# Patient Record
Sex: Male | Born: 1965
Health system: Southern US, Community
[De-identification: ages and names within clinical notes are randomized; demographics above are authoritative.]

## PROBLEM LIST (undated history)

## (undated) DIAGNOSIS — K219 Gastro-esophageal reflux disease without esophagitis: Secondary | ICD-10-CM

## (undated) DIAGNOSIS — F411 Generalized anxiety disorder: Secondary | ICD-10-CM

## (undated) DIAGNOSIS — F419 Anxiety disorder, unspecified: Secondary | ICD-10-CM

## (undated) DIAGNOSIS — E785 Hyperlipidemia, unspecified: Secondary | ICD-10-CM

## (undated) DIAGNOSIS — M1990 Unspecified osteoarthritis, unspecified site: Secondary | ICD-10-CM

## (undated) HISTORY — DX: Hyperlipidemia, unspecified: E78.5

## (undated) HISTORY — DX: Gastro-esophageal reflux disease without esophagitis: K21.9

## (undated) HISTORY — DX: Anxiety disorder, unspecified: F41.9

## (undated) HISTORY — DX: Unspecified osteoarthritis, unspecified site: M19.90

## (undated) HISTORY — PX: LEG SURGERY: SHX1003

## (undated) HISTORY — DX: Generalized anxiety disorder: F41.1

---

## 2009-12-26 ENCOUNTER — Emergency Department: Payer: Self-pay | Admitting: Emergency Medicine

## 2012-12-10 ENCOUNTER — Observation Stay: Payer: Self-pay | Admitting: Internal Medicine

## 2012-12-10 DIAGNOSIS — R079 Chest pain, unspecified: Secondary | ICD-10-CM

## 2012-12-10 LAB — BASIC METABOLIC PANEL
Anion Gap: 8 (ref 7–16)
BUN: 10 mg/dL (ref 7–18)
Calcium, Total: 8.8 mg/dL (ref 8.5–10.1)
Chloride: 111 mmol/L — ABNORMAL HIGH (ref 98–107)
Co2: 23 mmol/L (ref 21–32)
Creatinine: 0.88 mg/dL (ref 0.60–1.30)
EGFR (African American): >60
EGFR (Non-African Amer.): >60
Glucose: 94 mg/dL (ref 65–99)
Osmolality: 282 (ref 275–301)
Potassium: 4 mmol/L (ref 3.5–5.1)
Sodium: 142 mmol/L (ref 136–145)

## 2012-12-10 LAB — CBC
HCT: 40.8 % (ref 40.0–52.0)
HGB: 14.3 g/dL (ref 13.0–18.0)
MCH: 34 pg (ref 26.0–34.0)
MCHC: 35 g/dL (ref 32.0–36.0)
MCV: 97 fL (ref 80–100)
Platelet: 203 10*3/uL (ref 150–440)
RBC: 4.2 10*6/uL — ABNORMAL LOW (ref 4.40–5.90)
RDW: 13.4 % (ref 11.5–14.5)
WBC: 8 10*3/uL (ref 3.8–10.6)

## 2012-12-10 LAB — CK TOTAL AND CKMB (NOT AT ARMC)
CK, Total: 162 U/L (ref 35–232)
CK, Total: 123 U/L (ref 35–232)
CK, Total: 107 U/L (ref 35–232)
CK-MB: 0.8 ng/mL (ref 0.5–3.6)
CK-MB: 0.6 ng/mL (ref 0.5–3.6)
CK-MB: 0.5 ng/mL (ref 0.5–3.6)

## 2012-12-10 LAB — TROPONIN I
Troponin-I: <0.02 ng/mL
Troponin-I: <0.02 ng/mL
Troponin-I: <0.02 ng/mL

## 2012-12-10 LAB — LACTATE DEHYDROGENASE: LDH: 178 U/L (ref 85–241)

## 2012-12-11 LAB — COMPREHENSIVE METABOLIC PANEL
Albumin: 3.8 g/dL (ref 3.4–5.0)
Alkaline Phosphatase: 95 U/L (ref 50–136)
Anion Gap: 8 (ref 7–16)
BUN: 13 mg/dL (ref 7–18)
Bilirubin,Total: 0.2 mg/dL (ref 0.2–1.0)
Calcium, Total: 8.6 mg/dL (ref 8.5–10.1)
Chloride: 110 mmol/L — ABNORMAL HIGH (ref 98–107)
Co2: 22 mmol/L (ref 21–32)
Creatinine: 0.8 mg/dL (ref 0.60–1.30)
EGFR (African American): >60
EGFR (Non-African Amer.): >60
Glucose: 93 mg/dL (ref 65–99)
Osmolality: 279 (ref 275–301)
Potassium: 4.3 mmol/L (ref 3.5–5.1)
SGOT(AST): 20 U/L (ref 15–37)
SGPT (ALT): 25 U/L (ref 12–78)
Sodium: 140 mmol/L (ref 136–145)
Total Protein: 7.2 g/dL (ref 6.4–8.2)

## 2012-12-11 LAB — CBC WITH DIFFERENTIAL/PLATELET
Basophil #: 0.1 10*3/uL (ref 0.0–0.1)
Basophil %: 1 %
Eosinophil #: 0.7 10*3/uL (ref 0.0–0.7)
Eosinophil %: 9.1 %
HCT: 43.8 % (ref 40.0–52.0)
HGB: 15.2 g/dL (ref 13.0–18.0)
Lymphocyte #: 2.6 10*3/uL (ref 1.0–3.6)
Lymphocyte %: 31.8 %
MCH: 33.6 pg (ref 26.0–34.0)
MCHC: 34.8 g/dL (ref 32.0–36.0)
MCV: 97 fL (ref 80–100)
Monocyte #: 0.7 x10 3/mm (ref 0.2–1.0)
Monocyte %: 8.6 %
Neutrophil #: 4 10*3/uL (ref 1.4–6.5)
Neutrophil %: 49.5 %
Platelet: 198 10*3/uL (ref 150–440)
RBC: 4.54 10*6/uL (ref 4.40–5.90)
RDW: 13.2 % (ref 11.5–14.5)
WBC: 8.1 10*3/uL (ref 3.8–10.6)

## 2012-12-11 LAB — LIPID PANEL
Cholesterol: 144 mg/dL (ref 0–200)
HDL Cholesterol: 24 mg/dL — ABNORMAL LOW (ref 40–60)
Ldl Cholesterol, Calc: 82 mg/dL (ref 0–100)
Triglycerides: 191 mg/dL (ref 0–200)
VLDL Cholesterol, Calc: 38 mg/dL (ref 5–40)

## 2012-12-26 ENCOUNTER — Other Ambulatory Visit: Payer: Self-pay | Admitting: Cardiovascular Disease

## 2013-09-18 ENCOUNTER — Encounter: Payer: Self-pay | Admitting: Internal Medicine

## 2013-10-19 ENCOUNTER — Telehealth: Payer: Self-pay | Admitting: Internal Medicine

## 2013-10-19 ENCOUNTER — Ambulatory Visit: Payer: BC Managed Care – PPO | Admitting: Internal Medicine

## 2013-11-20 ENCOUNTER — Ambulatory Visit: Payer: BC Managed Care – PPO | Admitting: Internal Medicine

## 2014-01-26 IMAGING — CT CT CHEST-ABD-PELV W/ CM
1 of 3 series · 13 of 31 positions shown, 18 images · non-contrast
Comparison: none

REASON FOR EXAM: (1) weight loss, r/o malignancy; (2) ?t loss, r/o
malignancy
COMMENTS:

[Series 2: soft tissue · axial · 0.74mm/px · z∈[-483,+99]mm · 13 of 224 slices shown, 18 images]
[im 15/224  mediastinal]
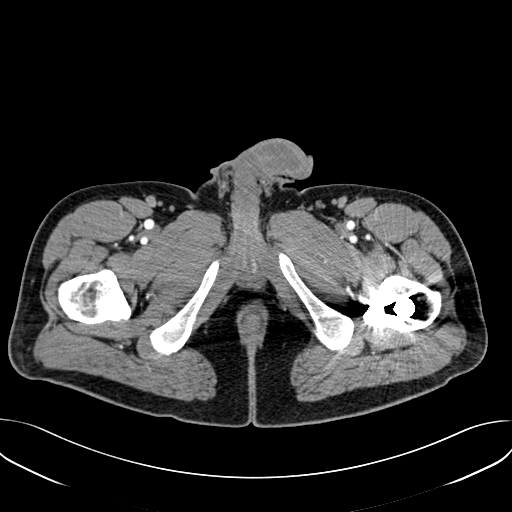
[im 15/224  bone]
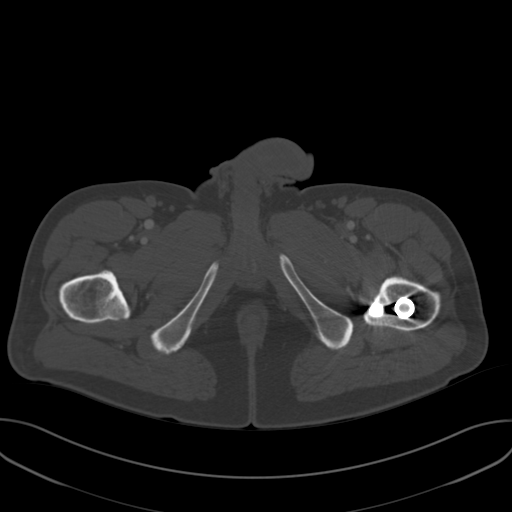
[im 45/224  mediastinal]
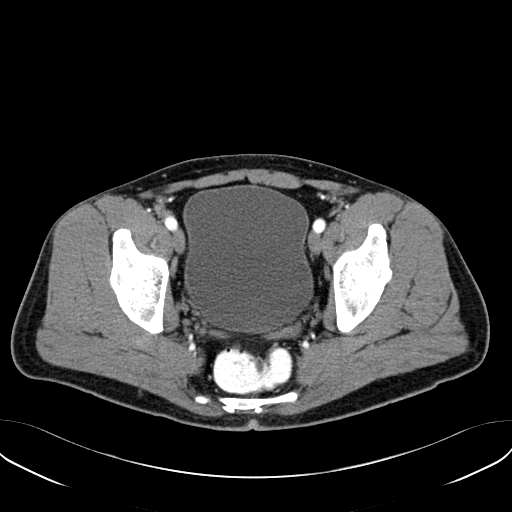
[im 60/224  mediastinal]
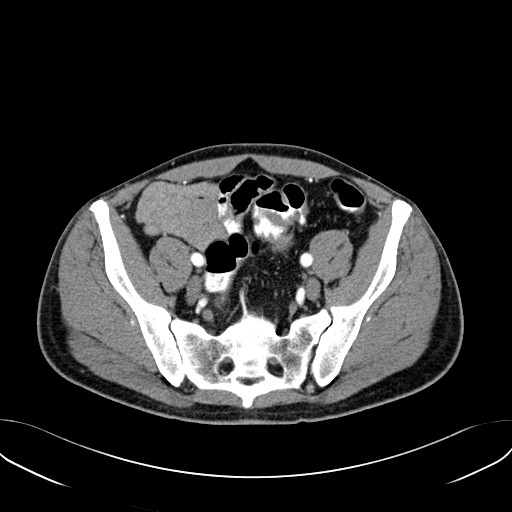
[im 75/224  mediastinal]
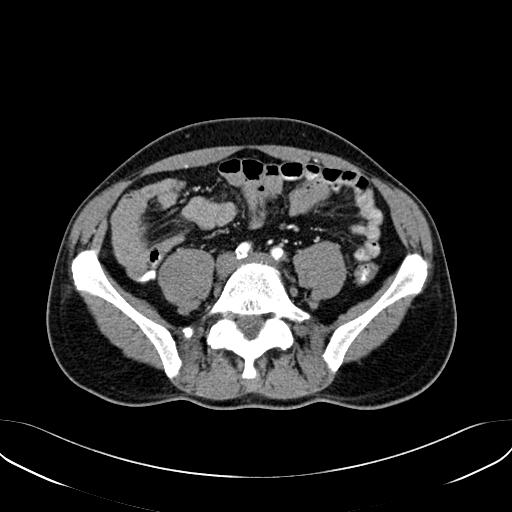
[im 105/224  mediastinal]
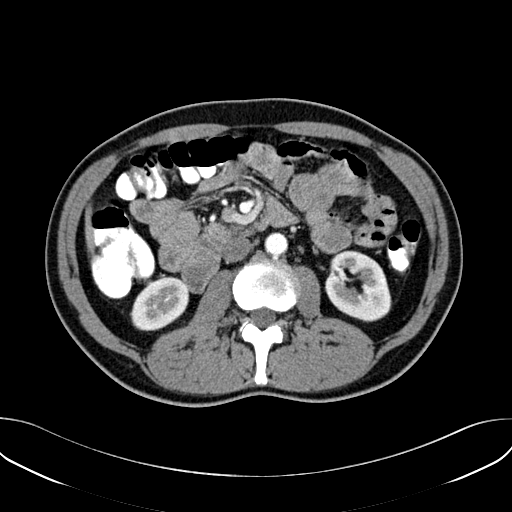
[im 110/224  mediastinal]
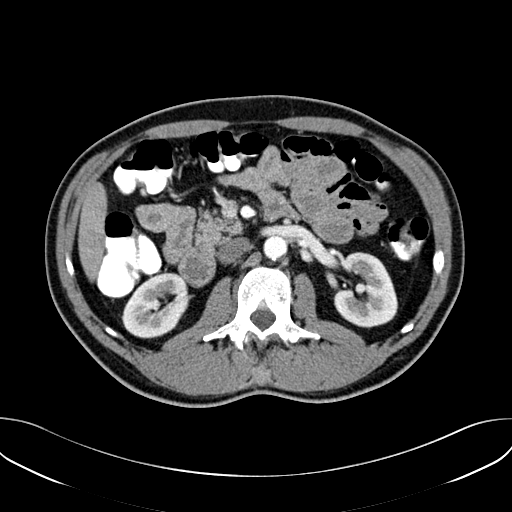
[im 112/224  mediastinal]
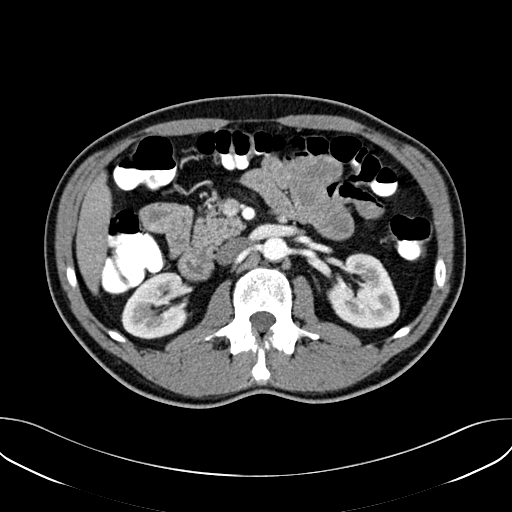
[im 134/224  mediastinal]
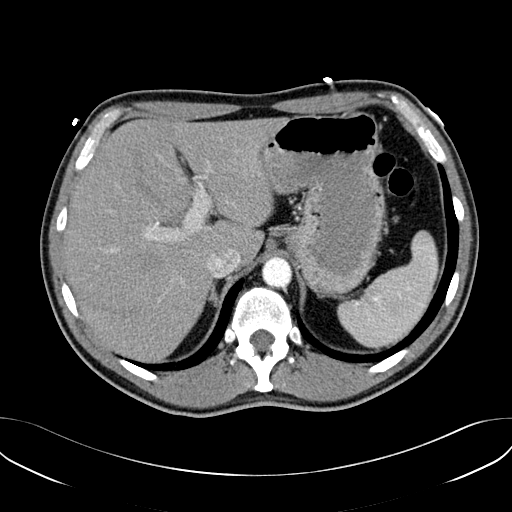
[im 149/224  mediastinal]
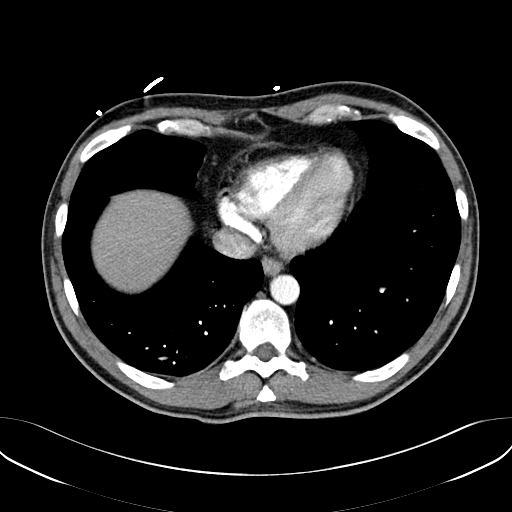
[im 149/224  bone]
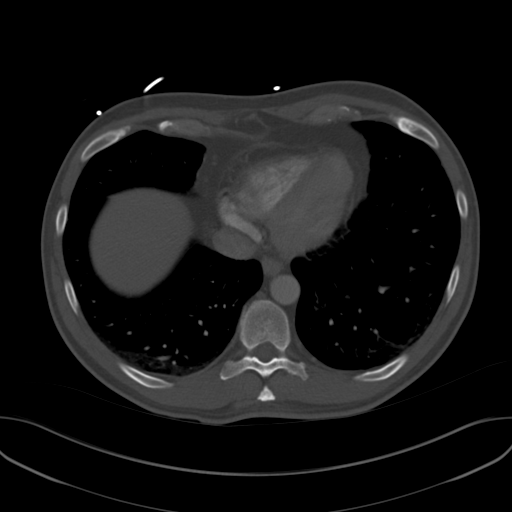
[im 164/224  mediastinal]
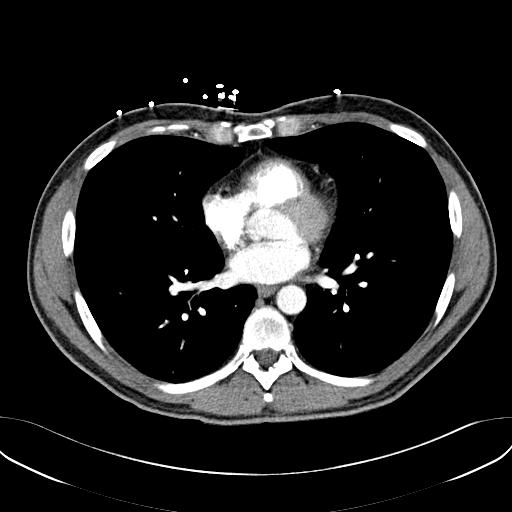
[im 164/224  lung]
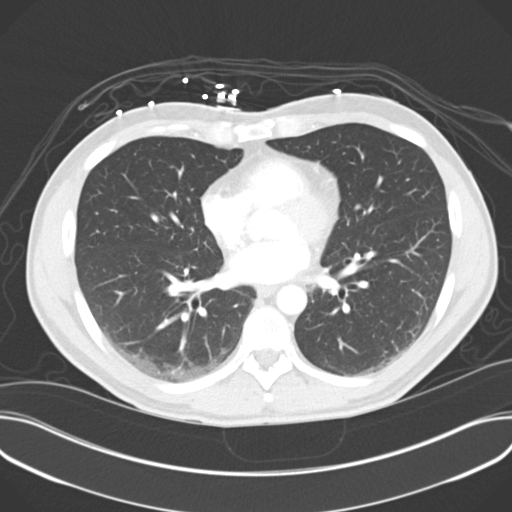
[im 179/224  lung]
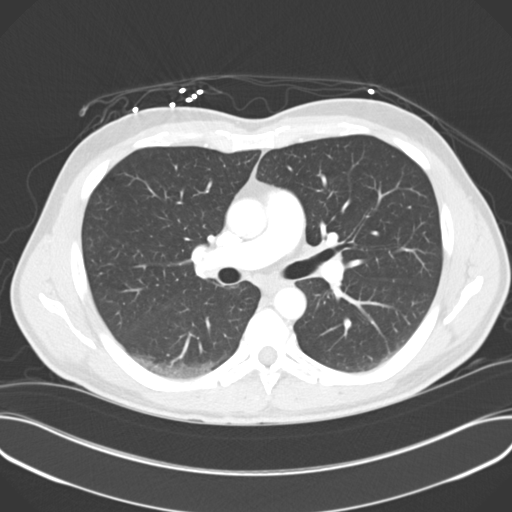
[im 194/224  mediastinal]
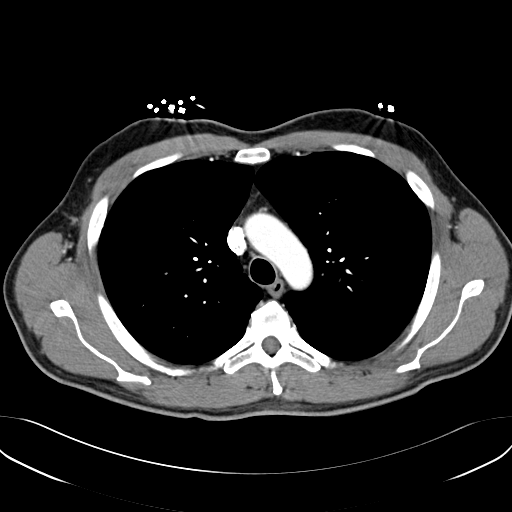
[im 194/224  lung]
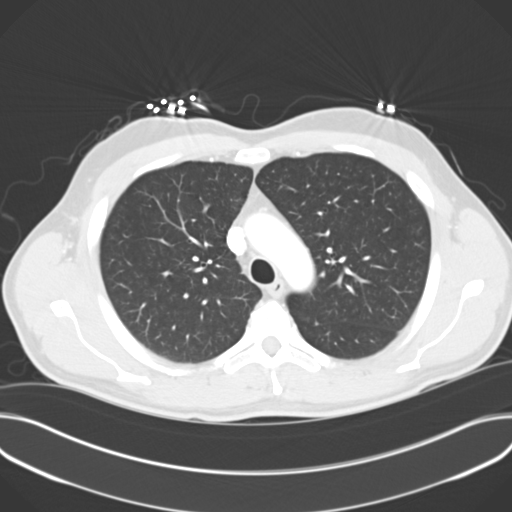
[im 209/224  mediastinal]
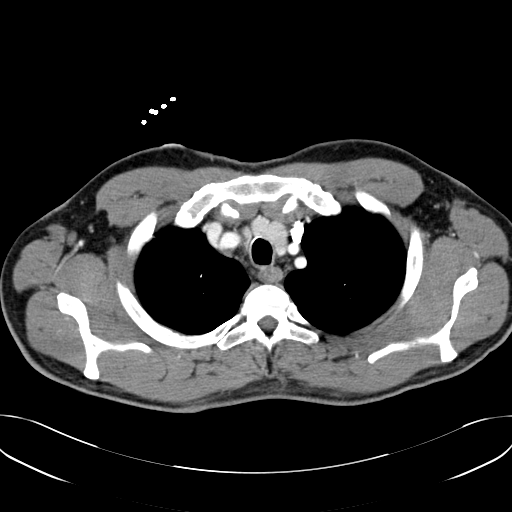
[im 209/224  lung]
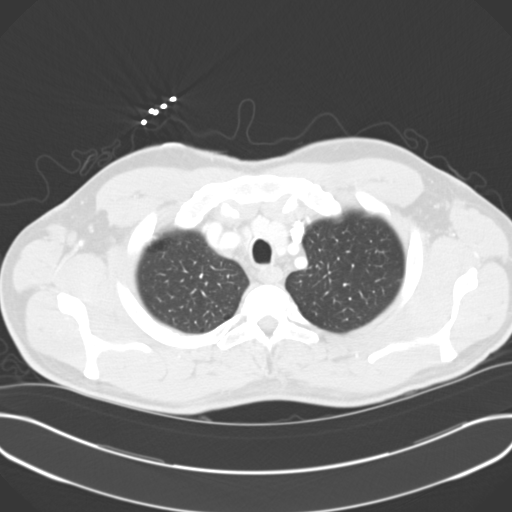

[13 of 31 positions shown; findings below may reference images not displayed]

PROCEDURE:     CT  - CT CHEST ABDOMEN AND PELVIS W  - December 10, 2012 [DATE]

RESULT:     CT of the chest common abdomen and pelvis is performed with oral
contrast and 100 mL of Asovue-ZCC iodinated intravenous contrast. Comparison
is made to previous renal stone protocol of the abdomen and pelvis from 26 December, 2009.

Dependent atelectasis is present in both lungs. There is no discrete
pulmonary parenchymal mass. There is no edema, effusion or pneumothorax.
Tiny bullous areas are seen in the medial lung apices bilaterally. The heart
is normal in size. No pleural or pericardial effusion is seen. The thyroid
lobes appear unremarkable. There is no supraclavicular, mediastinal, hilar
or axillary mass or adenopathy. There is a tiny subdiaphragmatic
low-attenuation area in the right lobe of the liver with fatty attenuation
which is too small for further characterization measuring 9 mm maximal
dimension. No other focal hepatic lesion is evident. No radiopaque
gallstones are evident. There is no oral contrast in the stomach or proximal
small bowel. There is minimal residual contrast in the distal small bowel.
The majority of contrast is in the colon. There is some mild diffuse
thickening of the wall of the sigmoid colon which could represent colitis or
chronic changes of diverticulitis. Colonoscopy would be recommended for
further assessment if the patient is asymptomatic for either of these. The
colon otherwise appears to be unremarkable. No abnormal bowel distention is
evident. There is no mesenteric or retroperitoneal mass or adenopathy. No
inguinal or pelvic mass or adenopathy is evident. There is artifact from the
patient's left hip hardware. The urinary bladder appears unremarkable. There
is no ascites areas there does not appear to be definite colonic
diverticulosis. There is some scattered atherosclerotic calcification and
irregularity in the distal abdominal aorta. The adrenal glands appear
normal. There is an exophytic cyst from the left kidney upper pole region
measuring 2.3 cm. There is a smaller cyst in the mid right kidney measuring
5 mm. No definite renal calculi are appreciated. The spleen is nonenlarged.
The pancreas shows no discrete mass or ductal dilation. There is no evidence
of adenopathy in the porta hepatis region. The abdominal wall shows a tiny
fat filled hernia in the umbilical region.
IMPRESSION: 1. Mild thickening of the wall of the sigmoid colon without definite
diverticulosis. Changes of colitis or thickening from chronic inflammation
is not excluded. If the patient has no evidence of acute infectious or
inflammatory process, further investigation with sigmoidoscopy or
colonoscopy would be recommended.
2. No adenopathy. No focal pulmonary disease. Renal cysts. A small fatty
density in the subdiaphragmatic region at the right lobe of the liver. Poor
small bowel opacification limits evaluation. There is no contrast in the
stomach. Correlation with upper endoscopy could be considered if the patient
has symptoms referable to this region.

[REDACTED]

## 2014-06-25 ENCOUNTER — Ambulatory Visit (INDEPENDENT_AMBULATORY_CARE_PROVIDER_SITE_OTHER): Payer: Self-pay | Admitting: Emergency Medicine

## 2014-06-25 ENCOUNTER — Encounter: Payer: Self-pay | Admitting: Emergency Medicine

## 2014-06-25 VITALS — BP 118/68 | HR 96 | Temp 100.0°F | Resp 16 | Wt 146.6 lb

## 2014-06-25 DIAGNOSIS — F411 Generalized anxiety disorder: Secondary | ICD-10-CM

## 2014-06-25 DIAGNOSIS — R5381 Other malaise: Secondary | ICD-10-CM

## 2014-06-25 DIAGNOSIS — R5383 Other fatigue: Principal | ICD-10-CM

## 2014-06-25 LAB — CBC WITH DIFFERENTIAL/PLATELET
Basophils Absolute: 0.1 10*3/uL (ref 0.0–0.1)
Basophils Relative: 1 % (ref 0–1)
Eosinophils Absolute: 0.3 10*3/uL (ref 0.0–0.7)
Eosinophils Relative: 4 % (ref 0–5)
HCT: 46.2 % (ref 39.0–52.0)
Hemoglobin: 16.1 g/dL (ref 13.0–17.0)
Lymphocytes Relative: 31 % (ref 12–46)
Lymphs Abs: 2.4 10*3/uL (ref 0.7–4.0)
MCH: 33.7 pg (ref 26.0–34.0)
MCHC: 34.8 g/dL (ref 30.0–36.0)
MCV: 96.7 fL (ref 78.0–100.0)
Monocytes Absolute: 0.8 10*3/uL (ref 0.1–1.0)
Monocytes Relative: 10 % (ref 3–12)
Neutro Abs: 4.2 10*3/uL (ref 1.7–7.7)
Neutrophils Relative %: 54 % (ref 43–77)
Platelets: 232 10*3/uL (ref 150–400)
RBC: 4.78 MIL/uL (ref 4.22–5.81)
RDW: 13.8 % (ref 11.5–15.5)
WBC: 7.8 10*3/uL (ref 4.0–10.5)

## 2014-06-25 MED ORDER — DIAZEPAM 2 MG PO TABS: 2.0000 mg | ORAL_TABLET | Freq: Every day | ORAL | Status: DC

## 2014-06-25 MED ORDER — SERTRALINE HCL 50 MG PO TABS: 50.0000 mg | ORAL_TABLET | Freq: Every day | ORAL | Status: DC

## 2014-06-25 NOTE — Patient Instructions (Signed)
Insomnia  PROTEIN PROTEIN PROTEIN Insomnia is frequent trouble falling and/or staying asleep. Insomnia can be a long term problem or a short term problem. Both are common. Insomnia can be a short term problem when the wakefulness is related to a certain stress or worry. Long term insomnia is often related to ongoing stress during waking hours and/or poor sleeping habits. Overtime, sleep deprivation itself can make the problem worse. Every little thing feels more severe because you are overtired and your ability to cope is decreased. CAUSES   Stress, anxiety, and depression.  Poor sleeping habits.  Distractions such as TV in the bedroom.  Naps close to bedtime.  Engaging in emotionally charged conversations before bed.  Technical reading before sleep.  Alcohol and other sedatives. They may make the problem worse. They can hurt normal sleep patterns and normal dream activity.  Stimulants such as caffeine for several hours prior to bedtime.  Pain syndromes and shortness of breath can cause insomnia.  Exercise late at night.  Changing time zones may cause sleeping problems (jet lag). It is sometimes helpful to have someone observe your sleeping patterns. They should look for periods of not breathing during the night (sleep apnea). They should also look to see how long those periods last. If you live alone or observers are uncertain, you can also be observed at a sleep clinic where your sleep patterns will be professionally monitored. Sleep apnea requires a checkup and treatment. Give your caregivers your medical history. Give your caregivers observations your family has made about your sleep.  SYMPTOMS   Not feeling rested in the morning.  Anxiety and restlessness at bedtime.  Difficulty falling and staying asleep. TREATMENT   Your caregiver may prescribe treatment for an underlying medical disorders. Your caregiver can give advice or help if you are using alcohol or other drugs for  self-medication. Treatment of underlying problems will usually eliminate insomnia problems.  Medications can be prescribed for short time use. They are generally not recommended for lengthy use.  Over-the-counter sleep medicines are not recommended for lengthy use. They can be habit forming.  You can promote easier sleeping by making lifestyle changes such as:  Using relaxation techniques that help with breathing and reduce muscle tension.  Exercising earlier in the day.  Changing your diet and the time of your last meal. No night time snacks.  Establish a regular time to go to bed.  Counseling can help with stressful problems and worry.  Soothing music and white noise may be helpful if there are background noises you cannot remove.  Stop tedious detailed work at least one hour before bedtime. HOME CARE INSTRUCTIONS   Keep a diary. Inform your caregiver about your progress. This includes any medication side effects. See your caregiver regularly. Take note of:  Times when you are asleep.  Times when you are awake during the night.  The quality of your sleep.  How you feel the next day. This information will help your caregiver care for you.  Get out of bed if you are still awake after 15 minutes. Read or do some quiet activity. Keep the lights down. Wait until you feel sleepy and go back to bed.  Keep regular sleeping and waking hours. Avoid naps.  Exercise regularly.  Avoid distractions at bedtime. Distractions include watching television or engaging in any intense or detailed activity like attempting to balance the household checkbook.  Develop a bedtime ritual. Keep a familiar routine of bathing, brushing your teeth, climbing into  bed at the same time each night, listening to soothing music. Routines increase the success of falling to sleep faster.  Use relaxation techniques. This can be using breathing and muscle tension release routines. It can also include visualizing  peaceful scenes. You can also help control troubling or intruding thoughts by keeping your mind occupied with boring or repetitive thoughts like the old concept of counting sheep. You can make it more creative like imagining planting one beautiful flower after another in your backyard garden.  During your day, work to eliminate stress. When this is not possible use some of the previous suggestions to help reduce the anxiety that accompanies stressful situations. MAKE SURE YOU:   Understand these instructions.  Will watch your condition.  Will get help right away if you are not doing well or get worse. Document Released: 12/10/2000 Document Revised: 03/06/2012 Document Reviewed: 01/10/2008 [facility] Patient Information 2015 [place], [place]. This information is not intended to replace advice given to you by your health care provider. Make sure you discuss any questions you have with your health care provider.  Generalized Anxiety Disorder Generalized anxiety disorder (GAD) is a mental disorder. It interferes with life functions, including relationships, work, and school. GAD is different from normal anxiety, which everyone experiences at some point in their lives in response to specific life events and activities. Normal anxiety actually helps [place] prepare for and get through these life events and activities. Normal anxiety goes away after the event or activity is over.  GAD causes anxiety that is not necessarily related to specific events or activities. It also causes excess anxiety in proportion to specific events or activities. The anxiety associated with GAD is also difficult to control. GAD can vary from mild to severe. People with severe GAD can have intense waves of anxiety with physical symptoms (panic attacks).  SYMPTOMS The anxiety and worry associated with GAD are difficult to control. This anxiety and worry are related to many life events and activities and also occur more days than not for  6 months or longer. People with GAD also have three or more of the following symptoms (one or more in children):  Restlessness.   Fatigue.  Difficulty concentrating.   Irritability.  Muscle tension.  Difficulty sleeping or unsatisfying sleep. DIAGNOSIS GAD is diagnosed through an assessment by your caregiver. Your caregiver will ask you questions aboutyour mood,physical symptoms, and events in your life. Your caregiver may ask you about your medical history and use of alcohol or drugs, including prescription medications. Your caregiver may also do a physical exam and blood tests. Certain medical conditions and the use of certain substances can cause symptoms similar to those associated with GAD. Your caregiver may refer you to a mental health specialist for further evaluation. TREATMENT The following therapies are usually used to treat GAD:   Medication--Antidepressant medication usually is prescribed for long-term daily control. Antianxiety medications may be added in severe cases, especially when panic attacks occur.   Talk therapy (psychotherapy)--Certain types of talk therapy can be helpful in treating GAD by providing support, education, and guidance. A form of talk therapy called cognitive behavioral therapy can teach you healthy ways to think about and react to daily life events and activities.  Stress managementtechniques--These include yoga, meditation, and exercise and can be very helpful when they are practiced regularly. A mental health specialist can help determine which treatment is best for you. Some people see improvement with one therapy. However, other people require a combination of therapies.  Document Released: 04/09/2013 Document Reviewed: 04/09/2013 [facility] Patient Information 2015 [place]. This information is not intended to replace advice given to you by your health care provider. Make sure you discuss any questions you have with your health care  provider.

## 2014-06-25 NOTE — Progress Notes (Signed)
   Subjective:    Patient ID: Victor Martin, male    DOB: March 22, 1966, 48 y.o.   MRN: [id number]  HPI Comments: 48 yo WM with increased anxiety, insomnia, decreased appetite. He notes he feels paranoid with recent change in marital situation. He notes he started new job 06/04/14 and he cannot focus or remember how to do his job. He has lost almost 30 # since 2/15 with all of the stress. He is not sleeping well and even had difficulty driving home yesterday. He notes had "panic attack" at work today and decided to call family for help. He denies SI/ HI currently, but admits several weeks ago he had thoughts abut SI.   Anxiety Symptoms include confusion, decreased concentration and nervous/anxious behavior.       Medication List    Notice As of 06/25/2014  4:28 PM   You have not been prescribed any medications.      Allergies  Allergen Reactions  . Nicotine Itching    Nicotine patch   Past Medical History  Diagnosis Date  . GERD (gastroesophageal reflux disease)   . Hyperlipemia   . Anxiety   . Arthritis   . Anxiety state, unspecified 06/26/2014      Review of Systems  Constitutional: Positive for fatigue.  Psychiatric/Behavioral: Positive for confusion, sleep disturbance, decreased concentration and agitation. The patient is nervous/anxious.   All other systems reviewed and are negative.  BP 118/68  Pulse 96  Temp(Src) 100 F (37.8 C)  Resp 16  Wt 146 lb 9.6 oz (66.497 kg)     Objective:   Physical Exam  Nursing note and vitals reviewed. Constitutional: He is oriented to person, place, and time. He appears well-developed.  HENT:  Head: Normocephalic and atraumatic.  Right Ear: External ear normal.  Left Ear: External ear normal.  Nose: Nose normal.  Mouth/Throat: Oropharynx is clear and moist. No oropharyngeal exudate.  Eyes: Conjunctivae are normal.  Neck: Normal range of motion. No thyromegaly present.  Cardiovascular: Normal rate, regular rhythm, normal  heart sounds and intact distal pulses.   Pulmonary/Chest: Effort normal and breath sounds normal.  Abdominal: Soft. Bowel sounds are normal. He exhibits no distension. There is no tenderness.  Musculoskeletal: Normal range of motion.  Lymphadenopathy:    He has no cervical adenopathy.  Neurological: He is alert and oriented to person, place, and time. No cranial nerve deficit. Coordination normal.  Skin: Skin is warm and dry.  Psychiatric: He has a normal mood and affect. Judgment normal.  + tearful and concerned          Assessment & Plan:  1. Insomnia- Discussed sleep hygiene, advised protein snack before bed and OOW x 2 days. Valium 1 qhs call with results FRI  2. Anxiety/ Depression/ Fatigue- check labs, increase activity and H2O. Start Zoloft 50 mg AD, counseling recommended, w/c if SX increase or ER. Patient promised he would not hurt himself or others.

## 2014-06-26 ENCOUNTER — Encounter: Payer: Self-pay | Admitting: Emergency Medicine

## 2014-06-26 ENCOUNTER — Telehealth: Payer: Self-pay

## 2014-06-26 DIAGNOSIS — F411 Generalized anxiety disorder: Secondary | ICD-10-CM | POA: Insufficient documentation

## 2014-06-26 HISTORY — DX: Generalized anxiety disorder: F41.1

## 2014-06-26 LAB — COMPREHENSIVE METABOLIC PANEL
ALT: 16 U/L (ref 0–53)
AST: 16 U/L (ref 0–37)
Albumin: 5 g/dL (ref 3.5–5.2)
Alkaline Phosphatase: 84 U/L (ref 39–117)
BUN: 9 mg/dL (ref 6–23)
CO2: 25 mEq/L (ref 19–32)
Calcium: 9.8 mg/dL (ref 8.4–10.5)
Chloride: 104 mEq/L (ref 96–112)
Creat: 0.99 mg/dL (ref 0.50–1.35)
Glucose, Bld: 86 mg/dL (ref 70–99)
Potassium: 4 mEq/L (ref 3.5–5.3)
Sodium: 139 mEq/L (ref 135–145)
Total Bilirubin: 0.5 mg/dL (ref 0.2–1.2)
Total Protein: 7.1 g/dL (ref 6.0–8.3)

## 2014-06-26 LAB — TSH: TSH: 1.156 u[IU]/mL (ref 0.350–4.500)

## 2014-06-26 NOTE — Telephone Encounter (Signed)
Message copied by Joya MartyrZMENT, Suzanna Zahn M on Wed Jun 26, 2014  8:55 AM ------      Message from: Loree FeeSMITH, MELISSA R      Created: Wed Jun 26, 2014  6:33 AM       All labs ok. How did he sleep last night? Remind him to eat protein. Call with results FRI. ------

## 2014-06-26 NOTE — Telephone Encounter (Signed)
Left message for patient to return my call for lab results. 

## 2014-06-27 ENCOUNTER — Other Ambulatory Visit: Payer: Self-pay | Admitting: Emergency Medicine

## 2014-06-27 NOTE — Progress Notes (Signed)
Patient aware.

## 2014-09-24 ENCOUNTER — Encounter: Payer: Self-pay | Admitting: Emergency Medicine

## 2014-10-01 ENCOUNTER — Other Ambulatory Visit: Payer: Self-pay | Admitting: Emergency Medicine

## 2014-10-03 ENCOUNTER — Other Ambulatory Visit: Payer: Self-pay | Admitting: Emergency Medicine

## 2015-04-15 NOTE — Discharge Summary (Signed)
PATIENT NAME:  Victor Martin, Victor Martin MR#:  [id number] DATE OF BIRTH:  Jun 10, 1966  DATE OF ADMISSION:  12/10/2012 DATE OF DISCHARGE:  12/11/2012  Victor Martin.   PRIMARY CARE PHYSICIAN[place]   FINAL DIAGNOSES: 1.  Chest pain.  2.  Anxiety.  3.  Weight loss and night sweats.  4.  Plaque in the aorta.  5.  Tobacco abuse.   MEDICATIONS ON DISCHARGE: Include: Nicotine patch 21 mg per chest wall film extended-release daily, aspirin 81 mg daily, atorvastatin 10 mg at bedtime and Celexa 10 mg daily.   DIET: Regular diet, regular consistency.   ACTIVITY: As tolerated.   FOLLOWUP: [place]. [place]  CHIEF COMPLAINT: The patient came in with chest pain and weight loss.   HISTORY OF PRESENT ILLNESS: A 49 year old man with a dull tightness in the chest, more frequent and more intense, longer periods of time. He had an episode at work where he became lightheaded, dizzy and had chest pain and he decided to come in for further evaluation. He was admitted for chest pain. He was initially put on aspirin, beta blocker and nitro paste, but his blood pressure dropped down. He was unable to tolerate the beta blocker and nitro paste. CT scan of the chest, abdomen and pelvis was done because of the patient's weight loss and night sweats and fatigue. Laboratory and radiological data during the hospital course included LDH of 178. Troponin negative. Glucose 94, BUN 10, creatinine 0.88, sodium 142, potassium 4.0, chloride 111, CO2 23, calcium 8.8. White blood cell count 8.0, hemoglobin and hematocrit 14.3 and 40.8, platelet count 203. Chest x-ray showed no acute cardiopulmonary disease. CT scan of the chest, abdomen and pelvis showed mild thickening of the wall of the sigmoid colon without definite diverticulosis, changes of colitis or thickening is not excluded. If the patient has no evidence of infectious or inflammatory process, further investigation with colonoscopy can be done. No  adenopathy or focal pulmonary disease. A renal cyst. Small fatty density in the subdiaphragmatic region in the right lobe of the liver. Cardiac enzymes were negative x3. LDL 82, triglycerides 191, HDL 24. Stress test showed normal study. No evidence of ischemia or infarct. EF 72%. Good exercise capacity.   HOSPITAL COURSE PER PROBLEM LIST:  1.  For the patient's chest pain, the patient did not have anything that would cause this on CT scan and stress test was negative. Cardiac enzymes were negative. The patient was sent home in stable condition.  2.  The patient did have anxiety that could be part of the reason why he is having these physical symptoms. I did start Celexa 10 mg daily. I recommended him taking it prior to bed if it makes him tired. Side effect profile on low dose is usually tolerated very well. It is weight neutral. Some people can get some nausea with it, which is the biggest side effect.  3.  For the patient's weight loss and night sweats, the patient did have a CT scan of the chest, abdomen and pelvis. No cancerous process was seen. I do not believe this gentleman has a cancerous process. He did have a colonoscopy at age 49, which was completely negative. He is age 49. He is due for a colonoscopy at age 49. The patient did not have any signs of diverticulitis. No white count. No fever. I did not treat findings on CT scan.  4.  The patient did have plaque in the aorta on the CT  scan and recommended atorvastatin to lower plaque and also quit smoking.  5.  Tobacco abuse. Smoking cessation counseling done, 3 minutes by me. Nicotine patch applied.  TIME SPENT ON DISCHARGE: 35 minutes. ____________________________ Herschell Dimes. Renae Gloss, MD rjw:aw D: 12/11/2012 15:39:36 ET T: 12/12/2012 14:28:30 ET JOB#: [id number]  cc: Herschell Dimes. Renae Gloss, MD, <Dictator> DR. _____ IN Alvester Morin MD ELECTRONICALLY SIGNED 12/13/2012 15:10

## 2015-04-15 NOTE — Consult Note (Signed)
General Aspect 49 yo male with long smoking hx, anxiety, stressful job, presenting with chest tightness. Cardiology consult ordered for chest pain.  He reports stuttering chest painover the past few months, worse yesterday. It did not seem to go away. He was at work when it started. Work has been very stressful. 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 Outside of work, he does have some sx, but seems to be much less. Active at baseline. Some night sweats and weight loss. Etiology uncertain, possibly stress. He continues to smoke 1 ppd.   He presented to the ER given a strong family hx, worse chest pain.    Present Illness . Social: long smoking hx, 1 ppd, works in Academic librarian,  married.  Family hx: Father and mother with CAD, CABG   Physical Exam:   GEN well developed, well nourished, no acute distress    HEENT red conjunctivae    NECK supple  No masses    RESP normal resp effort  clear BS    CARD Regular rate and rhythm  No murmur    ABD denies tenderness  soft    LYMPH negative neck    EXTR negative edema    SKIN normal to palpation    NEURO motor/sensory function intact    PSYCH alert, A+O to time, place, person, good insight   Review of Systems:   Subjective/Chief Complaint chest pain, stress at work    General: Trouble sleeping  anxiety    Skin: No Complaints    ENT: No Complaints    Eyes: No Complaints    Neck: No Complaints    Respiratory: No Complaints    Cardiovascular: Chest pain or discomfort  Tightness    Gastrointestinal: No Complaints    Genitourinary: No Complaints    Vascular: No Complaints    Musculoskeletal: No Complaints    Neurologic: No Complaints    Hematologic: No Complaints    Endocrine: No Complaints    Psychiatric: No Complaints    Review of Systems: All other systems were reviewed and found to be negative    Medications/Allergies Reviewed Medications/Allergies reviewed     Kidney Stones:     Medal rods in left femur:    Femur Fracture Repair left:        Admit Diagnosis:   CHEST PAIN: 10-Dec-2012, Active, CHEST PAIN      Admit Reason:   Chest pain: (786.50) Active, ICD9, Unspecified chest pain  Lab Results:  Routine Chem:  15-Dec-13 04:09    LDH, Serum 178 (Result(s) reported on 10 Dec 2012 at 09:09AM.)   Glucose, Serum 94   BUN 10   Creatinine (comp) 0.88   Sodium, Serum 142   Potassium, Serum 4.0   Chloride, Serum  111   CO2, Serum 23   Calcium (Total), Serum 8.8   Anion Gap 8   Osmolality (calc) 282   eGFR (African American) >60   eGFR (Non-African American) >60 (eGFR values <53m/min/1.73 m2 may be an indication of chronic kidney disease (CKD). Calculated eGFR is useful in patients with stable renal function. The eGFR calculation will not be reliable in acutely ill patients when serum creatinine is changing rapidly. It is not useful in  patients on dialysis. The eGFR calculation may not be applicable to patients at the low and high extremes of body sizes, pregnant women, and vegetarians.)  Cardiac:  15-Dec-13 04:09    CK, Total 162   CPK-MB, Serum 0.8 (Result(s)  reported on 10 Dec 2012 at 04:40AM.)   Troponin I < 0.02 (0.00-0.05 0.05 ng/mL or less: NEGATIVE  Repeat testing in 3-6 hrs  if clinically indicated. >0.05 ng/mL: POTENTIAL  MYOCARDIAL INJURY. Repeat  testing in 3-6 hrs if  clinically indicated. NOTE: An increase or decrease  of 30% or more on serial  testing suggests a  clinically important change)    11:49    CK, Total 123   CPK-MB, Serum 0.6 (Result(s) reported on 10 Dec 2012 at 12:20PM.)   Troponin I < 0.02 (0.00-0.05 0.05 ng/mL or less: NEGATIVE  Repeat testing in 3-6 hrs  if clinically indicated. >0.05 ng/mL: POTENTIAL  MYOCARDIAL INJURY. Repeat  testing in 3-6 hrs if  clinically indicated. NOTE: An increase or decrease  of 30% or more on serial  testing suggests a  clinically important change)  Routine Hem:  15-Dec-13  04:09    WBC (CBC) 8.0   RBC (CBC)  4.20   Hemoglobin (CBC) 14.3   Hematocrit (CBC) 40.8   Platelet Count (CBC) 203 (Result(s) reported on 10 Dec 2012 at 04:30AM.)   MCV 97   MCH 34.0   MCHC 35.0   RDW 13.4   EKG:   Interpretation EKG shows NSR with no significant ST or T wave changes    No Known Allergies:   Vital Signs/Nurse's Notes: **Vital Signs.:   15-Dec-13 12:25   Vital Signs Type Routine   Temperature Temperature (F) 98   Celsius 36.6   Pulse Pulse 87   Respirations Respirations 18   Systolic BP Systolic BP 90   Diastolic BP (mmHg) Diastolic BP (mmHg) 50   Mean BP 63   Pulse Ox % Pulse Ox % 97   Pulse Ox Activity Level  At rest   Oxygen Delivery Room Air/ 21 %     Impression 49 yo male with long smoking hx, anxiety, stressful job, presenting with chest tightness. Cardiology consult ordered for chest pain.  1. chest pain EKG normal, cardiac enz neg x 2,  typical and atypical features. Worse with work stress. Family hx is significant, he has long smoking hx Review of CT shows no coronary calcifications, significant distal aorta and iliac disease (no stenosis) - on aspirin - stop ntp and metoprolol with hypotension - admitting physician ordered for cardiology consult- probable stress test am -Consider PPI  2. weight loss, night sweats - ct chest abdomen and pelvis- nothing to explain this - possible diverticulitis on ct- but no signs- no pain, normal wbc, no fever- - he had colonoscopy at age 89 which was negative- rec outpatient f/u  3. tobacco abuse- smoking cessation councelling done   4) Anxiety Suggested he follow up with PMD, Dr. Anitra Lauth to start medication either PRN or daily (paxil?)   Electronic Signatures: Ida Rogue (MD)  (Signed 15-Dec-13 15:06)  Authored: General Aspect/Present Illness, History and Physical Exam, Review of System, Past Medical History, Health Issues, Home Medications, Labs, EKG , Allergies, Vital Signs/Nurse's Notes,  Impression/Plan   Last Updated: 15-Dec-13 15:06 by Ida Rogue (MD)

## 2015-04-15 NOTE — H&P (Signed)
PATIENT NAME:  Victor Martin, Victor Martin MR#:  [id number] DATE OF BIRTH:  Apr 18, 1966  DATE OF ADMISSION:  12/10/2012  ADDENDUM   PHYSICAL EXAMINATION: VITAL SIGNS:  Temperature 98.6, pulse 75, respiratory rate 17 blood pressure 119/55, saturating 99% on room air.  GENERAL:  This is a frail male who appears older than his stated age who is comfortable and in no apparent distress.  HEENT:  Head normocephalic. Pupils equal, reactive to light. Pink conjunctivae. Anicteric sclerae. Moist oral mucosa.  NECK:  Supple. No thyromegaly. No JVD.  CHEST:  Good air entry bilaterally. No wheezing, rales, or rhonchi.  CARDIOVASCULAR:  S1, S2 heard. No rubs, murmur, or gallops.  ABDOMEN:  Soft, nontender, nondistended. Bowel sounds present.  EXTREMITIES:  No edema. No clubbing, no cyanosis.  PSYCHIATRIC:  Appropriate affect. Awake, alert x 3. Intact judgment and insight. NEUROLOGIC:  Motor 5 out of 5 motor in all extremities. Cranial nerves grossly intact.  SKIN:  Normal skin turgor. Warm and dry.   PERTINENT DIAGNOSTIC DATA:  Glucose 94, BUN 10, creatinine 0.88, sodium 142, potassium 4, chloride 111, CO2 of 23. Troponin less than 0.02. White blood cell 8, hemoglobin 14.3, hematocrit 40.8, platelets 203. EKG showing normal sinus rhythm without significant ST or T-wave changes at 77 beats per minute.   ASSESSMENT AND PLAN:  This is a 49 year old male who presents with chest pain, exertional, relieved by rest, as well complains of a 15-pound weight loss over 6 weeks.    1.  Chest pain, currently resolved. The patient received a full dose Lovenox and 324 mg of aspirin and nitroglycerin paste by the ED out of concern for unstable angina. The patient had negative troponin x 1. No EKG changes. Currently chest pain-free.  We will continue to cycle troponins. We will check lipid panel. Will consult cardiology to evaluate if there is any need to continue patient on the Lovenox out of concern of unstable angina.  We will continue  on baby aspirin and will check lipid panel. Will start on statin and low dose beta blocker.  2.   Weight loss with night sweats and fatigue, will place PPD. As well, we will check CT chest, abdomen, and pelvis with intravenous contrast to rule out any malignancy or lymphoma. As well we will check LDH.  3.  Tobacco abuse. The patient was counseled. He will be started on NicoDerm patch.  4.  Deep vein thrombosis prophylaxis. The patient is on full dose anticoagulation.  5.  CODE STATUS: FULL CODE.   TOTAL TIME SPENT ON ADMISSION AND PATIENT CARE:  55 minutes.    ____________________________ Starleen Armsawood S. Elgergawy, MD dse:ct D: 12/10/2012 07:48:13 ET T: 12/10/2012 14:27:17 ET JOB#: [id number]  cc: Starleen Armsawood S. Elgergawy, MD, <Dictator> DAWOOD Teena IraniS ELGERGAWY MD ELECTRONICALLY SIGNED 12/12/2012 1:48
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
## 2015-10-23 ENCOUNTER — Ambulatory Visit (INDEPENDENT_AMBULATORY_CARE_PROVIDER_SITE_OTHER): Payer: 59 | Admitting: Physician Assistant

## 2015-10-23 ENCOUNTER — Encounter: Payer: Self-pay | Admitting: Physician Assistant

## 2015-10-23 VITALS — BP 110/60 | HR 91 | Temp 97.7°F | Resp 14 | Ht 70.5 in | Wt 151.0 lb

## 2015-10-23 DIAGNOSIS — M5442 Lumbago with sciatica, left side: Secondary | ICD-10-CM

## 2015-10-23 MED ORDER — PREDNISONE 20 MG PO TABS: ORAL_TABLET | ORAL | Status: DC

## 2015-10-23 MED ORDER — BACLOFEN 10 MG PO TABS: 10.0000 mg | ORAL_TABLET | Freq: Two times a day (BID) | ORAL | Status: DC

## 2015-10-23 MED ORDER — HYDROCODONE-ACETAMINOPHEN 5-325 MG PO TABS: ORAL_TABLET | ORAL | Status: DC

## 2015-10-23 NOTE — Patient Instructions (Signed)
Sciatica With Rehab The sciatic nerve runs from the back down the leg and is responsible for sensation and control of the muscles in the back (posterior) side of the thigh, lower leg, and foot. Sciatica is a condition that is characterized by inflammation of this nerve.  SYMPTOMS   Signs of nerve damage, including numbness and/or weakness along the posterior side of the lower extremity.  Pain in the back of the thigh that may also travel down the leg.  Pain that worsens when sitting for long periods of time.  Occasionally, pain in the back or buttock. CAUSES  Inflammation of the sciatic nerve is the cause of sciatica. The inflammation is due to something irritating the nerve. Common sources of irritation include:  Sitting for long periods of time.  Direct trauma to the nerve.  Arthritis of the spine.  Herniated or ruptured disk.  Slipping of the vertebrae (spondylolisthesis).  Pressure from soft tissues, such as muscles or ligament-like tissue (fascia). RISK INCREASES WITH:  Sports that place pressure or stress on the spine (football or weightlifting).  Poor strength and flexibility.  Failure to warm up properly before activity.  Family history of low back pain or disk disorders.  Previous back injury or surgery.  Poor body mechanics, especially when lifting, or poor posture. PREVENTION   Warm up and stretch properly before activity.  Maintain physical fitness:  Strength, flexibility, and endurance.  Cardiovascular fitness.  Learn and use proper technique, especially with posture and lifting. When possible, have coach correct improper technique.  Avoid activities that place stress on the spine. PROGNOSIS If treated properly, then sciatica usually resolves within 6 weeks. However, occasionally surgery is necessary.  RELATED COMPLICATIONS   Permanent nerve damage, including pain, numbness, tingle, or weakness.  Chronic back pain.  Risks of surgery: infection,  bleeding, nerve damage, or damage to surrounding tissues. TREATMENT Treatment initially involves resting from any activities that aggravate your symptoms. The use of ice and medication may help reduce pain and inflammation. The use of strengthening and stretching exercises may help reduce pain with activity. These exercises may be performed at home or with referral to a therapist. A therapist may recommend further treatments, such as transcutaneous electronic nerve stimulation (TENS) or ultrasound. Your caregiver may recommend corticosteroid injections to help reduce inflammation of the sciatic nerve. If symptoms persist despite non-surgical (conservative) treatment, then surgery may be recommended. MEDICATION  If pain medication is necessary, then nonsteroidal anti-inflammatory medications, such as aspirin and ibuprofen, or other minor pain relievers, such as acetaminophen, are often recommended.  Do not take pain medication for 7 days before surgery.  Prescription pain relievers may be given if deemed necessary by your caregiver. Use only as directed and only as much as you need.  Ointments applied to the skin may be helpful.  Corticosteroid injections may be given by your caregiver. These injections should be reserved for the most serious cases, because they may only be given a certain number of times. HEAT AND COLD  Cold treatment (icing) relieves pain and reduces inflammation. Cold treatment should be applied for 10 to 15 minutes every 2 to 3 hours for inflammation and pain and immediately after any activity that aggravates your symptoms. Use ice packs or massage the area with a piece of ice (ice massage).  Heat treatment may be used prior to performing the stretching and strengthening activities prescribed by your caregiver, physical therapist, or athletic trainer. Use a heat pack or soak the injury in warm water.   SEEK MEDICAL CARE IF:  Treatment seems to offer no benefit, or the condition  worsens.  Any medications produce adverse side effects. EXERCISES  RANGE OF MOTION (ROM) AND STRETCHING EXERCISES - Sciatica Most people with sciatic will find that their symptoms worsen with either excessive bending forward (flexion) or arching at the low back (extension). The exercises which will help resolve your symptoms will focus on the opposite motion. Your physician, physical therapist or athletic trainer will help you determine which exercises will be most helpful to resolve your low back pain. Do not complete any exercises without first consulting with your clinician. Discontinue any exercises which worsen your symptoms until you speak to your clinician. If you have pain, numbness or tingling which travels down into your buttocks, leg or foot, the goal of the therapy is for these symptoms to move closer to your back and eventually resolve. Occasionally, these leg symptoms will get better, but your low back pain may worsen; this is typically an indication of progress in your rehabilitation. Be certain to be very alert to any changes in your symptoms and the activities in which you participated in the 24 hours prior to the change. Sharing this information with your clinician will allow him/her to most efficiently treat your condition. These exercises may help you when beginning to rehabilitate your injury. Your symptoms may resolve with or without further involvement from your physician, physical therapist or athletic trainer. While completing these exercises, remember:   Restoring tissue flexibility helps normal motion to return to the joints. This allows healthier, less painful movement and activity.  An effective stretch should be held for at least 30 seconds.  A stretch should never be painful. You should only feel a gentle lengthening or release in the stretched tissue. FLEXION RANGE OF MOTION AND STRETCHING EXERCISES: STRETCH - Flexion, Single Knee to Chest   Lie on a firm bed or floor  with both legs extended in front of you.  Keeping one leg in contact with the floor, bring your opposite knee to your chest. Hold your leg in place by either grabbing behind your thigh or at your knee.  Pull until you feel a gentle stretch in your low back. Hold __________ seconds.  Slowly release your grasp and repeat the exercise with the opposite side. Repeat __________ times. Complete this exercise __________ times per day.  STRETCH - Flexion, Double Knee to Chest  Lie on a firm bed or floor with both legs extended in front of you.  Keeping one leg in contact with the floor, bring your opposite knee to your chest.  Tense your stomach muscles to support your back and then lift your other knee to your chest. Hold your legs in place by either grabbing behind your thighs or at your knees.  Pull both knees toward your chest until you feel a gentle stretch in your low back. Hold __________ seconds.  Tense your stomach muscles and slowly return one leg at a time to the floor. Repeat __________ times. Complete this exercise __________ times per day.  STRETCH - Low Trunk Rotation   Lie on a firm bed or floor. Keeping your legs in front of you, bend your knees so they are both pointed toward the ceiling and your feet are flat on the floor.  Extend your arms out to the side. This will stabilize your upper body by keeping your shoulders in contact with the floor.  Gently and slowly drop both knees together to one side until   you feel a gentle stretch in your low back. Hold for __________ seconds.  Tense your stomach muscles to support your low back as you bring your knees back to the starting position. Repeat the exercise to the other side. Repeat __________ times. Complete this exercise __________ times per day  EXTENSION RANGE OF MOTION AND FLEXIBILITY EXERCISES: STRETCH - Extension, Prone on Elbows  Lie on your stomach on the floor, a bed will be too soft. Place your palms about shoulder  width apart and at the height of your head.  Place your elbows under your shoulders. If this is too painful, stack pillows under your chest.  Allow your body to relax so that your hips drop lower and make contact more completely with the floor.  Hold this position for __________ seconds.  Slowly return to lying flat on the floor. Repeat __________ times. Complete this exercise __________ times per day.  RANGE OF MOTION - Extension, Prone Press Ups  Lie on your stomach on the floor, a bed will be too soft. Place your palms about shoulder width apart and at the height of your head.  Keeping your back as relaxed as possible, slowly straighten your elbows while keeping your hips on the floor. You may adjust the placement of your hands to maximize your comfort. As you gain motion, your hands will come more underneath your shoulders.  Hold this position __________ seconds.  Slowly return to lying flat on the floor. Repeat __________ times. Complete this exercise __________ times per day.  STRENGTHENING EXERCISES - Sciatica  These exercises may help you when beginning to rehabilitate your injury. These exercises should be done near your "sweet spot." This is the neutral, low-back arch, somewhere between fully rounded and fully arched, that is your least painful position. When performed in this safe range of motion, these exercises can be used for people who have either a flexion or extension based injury. These exercises may resolve your symptoms with or without further involvement from your physician, physical therapist or athletic trainer. While completing these exercises, remember:   Muscles can gain both the endurance and the strength needed for everyday activities through controlled exercises.  Complete these exercises as instructed by your physician, physical therapist or athletic trainer. Progress with the resistance and repetition exercises only as your caregiver advises.  You may  experience muscle soreness or fatigue, but the pain or discomfort you are trying to eliminate should never worsen during these exercises. If this pain does worsen, stop and make certain you are following the directions exactly. If the pain is still present after adjustments, discontinue the exercise until you can discuss the trouble with your clinician. STRENGTHENING - Deep Abdominals, Pelvic Tilt   Lie on a firm bed or floor. Keeping your legs in front of you, bend your knees so they are both pointed toward the ceiling and your feet are flat on the floor.  Tense your lower abdominal muscles to press your low back into the floor. This motion will rotate your pelvis so that your tail bone is scooping upwards rather than pointing at your feet or into the floor.  With a gentle tension and even breathing, hold this position for __________ seconds. Repeat __________ times. Complete this exercise __________ times per day.  STRENGTHENING - Abdominals, Crunches   Lie on a firm bed or floor. Keeping your legs in front of you, bend your knees so they are both pointed toward the ceiling and your feet are flat on the   floor. Cross your arms over your chest.  Slightly tip your chin down without bending your neck.  Tense your abdominals and slowly lift your trunk high enough to just clear your shoulder blades. Lifting higher can put excessive stress on the low back and does not further strengthen your abdominal muscles.  Control your return to the starting position. Repeat __________ times. Complete this exercise __________ times per day.  STRENGTHENING - Quadruped, Opposite UE/LE Lift  Assume a hands and knees position on a firm surface. Keep your hands under your shoulders and your knees under your hips. You may place padding under your knees for comfort.  Find your neutral spine and gently tense your abdominal muscles so that you can maintain this position. Your shoulders and hips should form a rectangle  that is parallel with the floor and is not twisted.  Keeping your trunk steady, lift your right hand no higher than your shoulder and then your left leg no higher than your hip. Make sure you are not holding your breath. Hold this position __________ seconds.  Continuing to keep your abdominal muscles tense and your back steady, slowly return to your starting position. Repeat with the opposite arm and leg. Repeat __________ times. Complete this exercise __________ times per day.  STRENGTHENING - Abdominals and Quadriceps, Straight Leg Raise   Lie on a firm bed or floor with both legs extended in front of you.  Keeping one leg in contact with the floor, bend the other knee so that your foot can rest flat on the floor.  Find your neutral spine, and tense your abdominal muscles to maintain your spinal position throughout the exercise.  Slowly lift your straight leg off the floor about 6 inches for a count of 15, making sure to not hold your breath.  Still keeping your neutral spine, slowly lower your leg all the way to the floor. Repeat this exercise with each leg __________ times. Complete this exercise __________ times per day. POSTURE AND BODY MECHANICS CONSIDERATIONS - Sciatica Keeping correct posture when sitting, standing or completing your activities will reduce the stress put on different body tissues, allowing injured tissues a chance to heal and limiting painful experiences. The following are general guidelines for improved posture. Your physician or physical therapist will provide you with any instructions specific to your needs. While reading these guidelines, remember:  The exercises prescribed by your provider will help you have the flexibility and strength to maintain correct postures.  The correct posture provides the optimal environment for your joints to work. All of your joints have less wear and tear when properly supported by a spine with good posture. This means you will  experience a healthier, less painful body.  Correct posture must be practiced with all of your activities, especially prolonged sitting and standing. Correct posture is as important when doing repetitive low-stress activities (typing) as it is when doing a single heavy-load activity (lifting). RESTING POSITIONS Consider which positions are most painful for you when choosing a resting position. If you have pain with flexion-based activities (sitting, bending, stooping, squatting), choose a position that allows you to rest in a less flexed posture. You would want to avoid curling into a fetal position on your side. If your pain worsens with extension-based activities (prolonged standing, working overhead), avoid resting in an extended position such as sleeping on your stomach. Most people will find more comfort when they rest with their spine in a more neutral position, neither too rounded nor too   arched. Lying on a non-sagging bed on your side with a pillow between your knees, or on your back with a pillow under your knees will often provide some relief. Keep in mind, being in any one position for a prolonged period of time, no matter how correct your posture, can still lead to stiffness. PROPER SITTING POSTURE In order to minimize stress and discomfort on your spine, you must sit with correct posture Sitting with good posture should be effortless for a healthy body. Returning to good posture is a gradual process. Many people can work toward this most comfortably by using various supports until they have the flexibility and strength to maintain this posture on their own. When sitting with proper posture, your ears will fall over your shoulders and your shoulders will fall over your hips. You should use the back of the chair to support your upper back. Your low back will be in a neutral position, just slightly arched. You may place a small pillow or folded towel at the base of your low back for support.  When  working at a desk, create an environment that supports good, upright posture. Without extra support, muscles fatigue and lead to excessive strain on joints and other tissues. Keep these recommendations in mind: CHAIR:   A chair should be able to slide under your desk when your back makes contact with the back of the chair. This allows you to work closely.  The chair's height should allow your eyes to be level with the upper part of your monitor and your hands to be slightly lower than your elbows. BODY POSITION  Your feet should make contact with the floor. If this is not possible, use a foot rest.  Keep your ears over your shoulders. This will reduce stress on your neck and low back. INCORRECT SITTING POSTURES   If you are feeling tired and unable to assume a healthy sitting posture, do not slouch or slump. This puts excessive strain on your back tissues, causing more damage and pain. Healthier options include:  Using more support, like a lumbar pillow.  Switching tasks to something that requires you to be upright or walking.  Talking a brief walk.  Lying down to rest in a neutral-spine position. PROLONGED STANDING WHILE SLIGHTLY LEANING FORWARD  When completing a task that requires you to lean forward while standing in one place for a long time, place either foot up on a stationary 2-4 inch high object to help maintain the best posture. When both feet are on the ground, the low back tends to lose its slight inward curve. If this curve flattens (or becomes too large), then the back and your other joints will experience too much stress, fatigue more quickly and can cause pain.  CORRECT STANDING POSTURES Proper standing posture should be assumed with all daily activities, even if they only take a few moments, like when brushing your teeth. As in sitting, your ears should fall over your shoulders and your shoulders should fall over your hips. You should keep a slight tension in your abdominal  muscles to brace your spine. Your tailbone should point down to the ground, not behind your body, resulting in an over-extended swayback posture.  INCORRECT STANDING POSTURES  Common incorrect standing postures include a forward head, locked knees and/or an excessive swayback. WALKING Walk with an upright posture. Your ears, shoulders and hips should all line-up. PROLONGED ACTIVITY IN A FLEXED POSITION When completing a task that requires you to bend forward   at your waist or lean over a low surface, try to find a way to stabilize 3 of 4 of your limbs. You can place a hand or elbow on your thigh or rest a knee on the surface you are reaching across. This will provide you more stability so that your muscles do not fatigue as quickly. By keeping your knees relaxed, or slightly bent, you will also reduce stress across your low back. CORRECT LIFTING TECHNIQUES DO :   Assume a wide stance. This will provide you more stability and the opportunity to get as close as possible to the object which you are lifting.  Tense your abdominals to brace your spine; then bend at the knees and hips. Keeping your back locked in a neutral-spine position, lift using your leg muscles. Lift with your legs, keeping your back straight.  Test the weight of unknown objects before attempting to lift them.  Try to keep your elbows locked down at your sides in order get the best strength from your shoulders when carrying an object.  Always ask for help when lifting heavy or awkward objects. INCORRECT LIFTING TECHNIQUES DO NOT:   Lock your knees when lifting, even if it is a small object.  Bend and twist. Pivot at your feet or move your feet when needing to change directions.  Assume that you cannot safely pick up a paperclip without proper posture.   This information is not intended to replace advice given to you by your health care provider. Make sure you discuss any questions you have with your health care provider.     Document Released: 12/13/2005 Document Revised: 04/29/2015 Document Reviewed: 03/27/2009 [employer] Interactive Patient Education 2016 [employer]  

## 2015-10-23 NOTE — Progress Notes (Signed)
   Subjective:    Patient ID: Victor Martin, male    DOB: 25-May-1966, 49 y.o.   MRN: [id number]  HPI 49 y.o. 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 It started on right back but after last night has been having left back and buttocks to half way down his posterior leg. Patient denies fever, hematuria, incontinence, numbness, tingling, weakness and saddle anesthesia  Blood pressure 110/60, pulse 91, temperature 97.7 F (36.5 C), temperature source Temporal, resp. rate 14, height 5' 10.5" (1.791 m), weight 151 lb (68.493 kg), SpO2 98 %.  No current outpatient prescriptions on file prior to visit.   No current facility-administered medications on file prior to visit.    Past Medical History  Diagnosis Date  . GERD (gastroesophageal reflux disease)   . Hyperlipemia   . Anxiety   . Arthritis   . Anxiety state, unspecified 06/26/2014    Review of Systems  Constitutional: Negative.  Negative for fever and chills.  HENT: Negative.   Respiratory: Negative.   Cardiovascular: Negative.   Gastrointestinal: Negative.   Genitourinary: Negative.  Negative for difficulty urinating.  Musculoskeletal: Positive for back pain and gait problem. Negative for myalgias, joint swelling, arthralgias, neck pain and neck stiffness.  Skin: Negative.  Negative for rash.  Neurological: Negative.        Objective:   Physical Exam  Constitutional: He is oriented to person, place, and time. He appears well-developed and well-nourished.  HENT:  Head: Normocephalic and atraumatic.  Cardiovascular: Normal rate and regular rhythm.   Pulmonary/Chest: Effort normal and breath sounds normal.  Abdominal: Soft. Bowel sounds are normal. There is no tenderness.  Musculoskeletal:  Patient is able to ambulate well. Gait is   Antalgic. Straight leg raising with dorsiflexion positive bilaterally for radicular symptoms. Sensory exam in the legs are normal. Knee reflexes are normal Ankle reflexes are normal Strength is normal and symmetric in arms and legs. There is not SI tenderness to palpation.  There isparaspinal muscle spasm.  There is not midline tenderness.  ROM of spine with  limited in all spheres due to pain.   Neurological: He is alert and oriented to person, place, and time. He has normal reflexes.  Skin: No rash noted.       Assessment & Plan:  Lower back pain- + straight leg  Prednisone was prescribed,NSAIDs, RICE, and exercise given If not better follow up in office or will refer to PT/orthopedics. Natural history and expected course discussed. Questions answered. Proper lifting, bending technique discussed. Short (2-4 day) period of relative rest recommended until acute symptoms improve. Heat to affected area as needed for local pain relief.  Suggest CPE

## 2015-10-28 ENCOUNTER — Encounter: Payer: Self-pay | Admitting: Internal Medicine

## 2015-10-28 ENCOUNTER — Ambulatory Visit (INDEPENDENT_AMBULATORY_CARE_PROVIDER_SITE_OTHER): Payer: 59 | Admitting: Internal Medicine

## 2015-10-28 VITALS — BP 132/70 | HR 87 | Temp 98.1°F | Resp 16 | Ht 70.5 in | Wt 152.0 lb

## 2015-10-28 DIAGNOSIS — S060X1A Concussion with loss of consciousness of 30 minutes or less, initial encounter: Secondary | ICD-10-CM | POA: Diagnosis not present

## 2015-10-28 DIAGNOSIS — S01112A Laceration without foreign body of left eyelid and periocular area, initial encounter: Secondary | ICD-10-CM

## 2015-10-28 DIAGNOSIS — M5442 Lumbago with sciatica, left side: Secondary | ICD-10-CM | POA: Diagnosis not present

## 2015-10-28 DIAGNOSIS — Z23 Encounter for immunization: Secondary | ICD-10-CM | POA: Diagnosis not present

## 2015-10-28 MED ORDER — MELOXICAM 15 MG PO TABS: 15.0000 mg | ORAL_TABLET | Freq: Every day | ORAL | Status: DC

## 2015-10-28 NOTE — Patient Instructions (Signed)
Concussion, Adult  A concussion, or closed-head injury, is a brain injury caused by a direct blow to the head or by a quick and sudden movement (jolt) of the head or neck. Concussions are usually not life-threatening. Even so, the effects of a concussion can be serious. If you have had a concussion before, you are more likely to experience concussion-like symptoms after a direct blow to the head.   CAUSES  · Direct blow to the head, such as from running into another player during a soccer game, being hit in a fight, or hitting your head on a hard surface.  · A jolt of the head or neck that causes the brain to move back and forth inside the skull, such as in a car crash.  SIGNS AND SYMPTOMS  The signs of a concussion can be hard to notice. Early on, they may be missed by you, family members, and health care providers. You may look fine but act or feel differently.  Symptoms are usually temporary, but they may last for days, weeks, or even longer. Some symptoms may appear right away while others may not show up for hours or days. Every head injury is different. Symptoms include:  · Mild to moderate headaches that will not go away.  · A feeling of pressure inside your head.  · Having more trouble than usual:    Learning or remembering things you have heard.    Answering questions.    Paying attention or concentrating.    Organizing daily tasks.    Making decisions and solving problems.  · Slowness in thinking, acting or reacting, speaking, or reading.  · Getting lost or being easily confused.  · Feeling tired all the time or lacking energy (fatigued).  · Feeling drowsy.  · Sleep disturbances.    Sleeping more than usual.    Sleeping less than usual.    Trouble falling asleep.    Trouble sleeping (insomnia).  · Loss of balance or feeling lightheaded or dizzy.  · Nausea or vomiting.  · Numbness or tingling.  · Increased sensitivity to:    Sounds.    Lights.    Distractions.  · Vision problems or eyes that tire  easily.  · Diminished sense of taste or smell.  · Ringing in the ears.  · Mood changes such as feeling sad or anxious.  · Becoming easily irritated or angry for little or no reason.  · Lack of motivation.  · Seeing or hearing things other people do not see or hear (hallucinations).  DIAGNOSIS  Your health care provider can usually diagnose a concussion based on a description of your injury and symptoms. He or she will ask whether you passed out (lost consciousness) and whether you are having trouble remembering events that happened right before and during your injury.  Your evaluation might include:  · A brain scan to look for signs of injury to the brain. Even if the test shows no injury, you may still have a concussion.  · Blood tests to be sure other problems are not present.  TREATMENT  · Concussions are usually treated in an emergency department, in urgent care, or at a clinic. You may need to stay in the hospital overnight for further treatment.  · Tell your health care provider if you are taking any medicines, including prescription medicines, over-the-counter medicines, and natural remedies. Some medicines, such as blood thinners (anticoagulants) and aspirin, may increase the chance of complications. Also tell your health care   provider whether you have had alcohol or are taking illegal drugs. This information may affect treatment.  · Your health care provider will send you home with important instructions to follow.  · How fast you will recover from a concussion depends on many factors. These factors include how severe your concussion is, what part of your brain was injured, your age, and how healthy you were before the concussion.  · Most people with mild injuries recover fully. Recovery can take time. In general, recovery is slower in older persons. Also, persons who have had a concussion in the past or have other medical problems may find that it takes longer to recover from their current injury.  HOME  CARE INSTRUCTIONS  General Instructions  · Carefully follow the directions your health care provider gave you.  · Only take over-the-counter or prescription medicines for pain, discomfort, or fever as directed by your health care provider.  · Take only those medicines that your health care provider has approved.  · Do not drink alcohol until your health care provider says you are well enough to do so. Alcohol and certain other drugs may slow your recovery and can put you at risk of further injury.  · If it is harder than usual to remember things, write them down.  · If you are easily distracted, try to do one thing at a time. For example, do not try to watch TV while fixing dinner.  · Talk with family members or close friends when making important decisions.  · Keep all follow-up appointments. Repeated evaluation of your symptoms is recommended for your recovery.  · Watch your symptoms and tell others to do the same. Complications sometimes occur after a concussion. Older adults with a brain injury may have a higher risk of serious complications, such as a blood clot on the brain.  · Tell your teachers, school nurse, school counselor, coach, athletic trainer, or work manager about your injury, symptoms, and restrictions. Tell them about what you can or cannot do. They should watch for:    Increased problems with attention or concentration.    Increased difficulty remembering or learning new information.    Increased time needed to complete tasks or assignments.    Increased irritability or decreased ability to cope with stress.    Increased symptoms.  · Rest. Rest helps the brain to heal. Make sure you:    Get plenty of sleep at night. Avoid staying up late at night.    Keep the same bedtime hours on weekends and weekdays.    Rest during the day. Take daytime naps or rest breaks when you feel tired.  · Limit activities that require a lot of thought or concentration. These include:    Doing homework or job-related  work.    Watching TV.    Working on the computer.  · Avoid any situation where there is potential for another head injury (football, hockey, soccer, basketball, martial arts, downhill snow sports and horseback riding). Your condition will get worse every time you experience a concussion. You should avoid these activities until you are evaluated by the appropriate follow-up health care providers.  Returning To Your Regular Activities  You will need to return to your normal activities slowly, not all at once. You must give your body and brain enough time for recovery.  · Do not return to sports or other athletic activities until your health care provider tells you it is safe to do so.  · Ask   your health care provider when you can drive, ride a bicycle, or operate heavy machinery. Your ability to react may be slower after a brain injury. Never do these activities if you are dizzy.  · Ask your health care provider about when you can return to work or school.  Preventing Another Concussion  It is very important to avoid another brain injury, especially before you have recovered. In rare cases, another injury can lead to permanent brain damage, brain swelling, or death. The risk of this is greatest during the first 7-10 days after a head injury. Avoid injuries by:  · Wearing a seat belt when riding in a car.  · Drinking alcohol only in moderation.  · Wearing a helmet when biking, skiing, skateboarding, skating, or doing similar activities.  · Avoiding activities that could lead to a second concussion, such as contact or recreational sports, until your health care provider says it is okay.  · Taking safety measures in your home.    Remove clutter and tripping hazards from floors and stairways.    Use grab bars in bathrooms and handrails by stairs.    Place non-slip mats on floors and in bathtubs.    Improve lighting in dim areas.  SEEK MEDICAL CARE IF:  · You have increased problems paying attention or  concentrating.  · You have increased difficulty remembering or learning new information.  · You need more time to complete tasks or assignments than before.  · You have increased irritability or decreased ability to cope with stress.  · You have more symptoms than before.  Seek medical care if you have any of the following symptoms for more than 2 weeks after your injury:  · Lasting (chronic) headaches.  · Dizziness or balance problems.  · Nausea.  · Vision problems.  · Increased sensitivity to noise or light.  · Depression or mood swings.  · Anxiety or irritability.  · Memory problems.  · Difficulty concentrating or paying attention.  · Sleep problems.  · Feeling tired all the time.  SEEK IMMEDIATE MEDICAL CARE IF:  · You have severe or worsening headaches. These may be a sign of a blood clot in the brain.  · You have weakness (even if only in one hand, leg, or part of the face).  · You have numbness.  · You have decreased coordination.  · You vomit repeatedly.  · You have increased sleepiness.  · One pupil is larger than the other.  · You have convulsions.  · You have slurred speech.  · You have increased confusion. This may be a sign of a blood clot in the brain.  · You have increased restlessness, agitation, or irritability.  · You are unable to recognize people or places.  · You have neck pain.  · It is difficult to wake you up.  · You have unusual behavior changes.  · You lose consciousness.  MAKE SURE YOU:  · Understand these instructions.  · Will watch your condition.  · Will get help right away if you are not doing well or get worse.     This information is not intended to replace advice given to you by your health care provider. Make sure you discuss any questions you have with your health care provider.     Document Released: 03/04/2004 Document Revised: 01/03/2015 Document Reviewed: 07/05/2013  Elsevier Interactive Patient Education ©2016 [employer]

## 2015-10-28 NOTE — Progress Notes (Signed)
Subjective:    Patient ID: Victor Martin, male    DOB: [birth year], 49 y.o.   MRN: [id number]  HPI  Patient returns to the office for reevaluation of back pain with sciatica of the left lower extremity. Patient was Victor Martin seen on 10/23/15 for back pain and was given prednisone, antiinflammatory, and RICE therapy.  He reports that he is feeling a lot better today.  He reports that he was having a hard time over the weekend.  He does still have a lot of numbness still going down the leg intermittently but it is better than it was.  He feels like his back pain is nearly back to his usual chronic back pain.  He reports that he did fall on Saturday.  He reports that the medications made him foggy headed and he was tripping and did hit his face on the door knob.  He reports that he thinks that he may have passed out.  He stopped taking the medications after.  He reports that he has had a headache since then.  He does report that the face is sore more than anything else.    Review of Systems  Constitutional: Negative for fever, chills and fatigue.  Eyes: Negative for visual disturbance.  Gastrointestinal: Negative for nausea and vomiting.  Genitourinary: Negative for dysuria, urgency, frequency and difficulty urinating.  Musculoskeletal: Positive for back pain.  Neurological: Negative for dizziness and light-headedness.       Objective:   Physical Exam  Constitutional: He is oriented to person, place, and time. He appears well-developed and well-nourished. No distress.  HENT:  Head: Normocephalic. Head is with contusion.    Mouth/Throat: Oropharynx is clear and moist. No oropharyngeal exudate.  Eyes: Conjunctivae are normal. No scleral icterus.  Neck: Normal range of motion. Neck supple. No JVD present. No thyromegaly present.  Cardiovascular: Normal rate, normal heart sounds and intact distal pulses.  Exam reveals no gallop and no friction rub.   No murmur heard. Pulmonary/Chest:  Effort normal and breath sounds normal. No respiratory distress. He has no wheezes. He has no rales. He exhibits no tenderness.  Abdominal: Soft. Bowel sounds are normal. He exhibits no distension and no mass. There is no tenderness. There is no rebound and no guarding.  Musculoskeletal: Normal range of motion.  Patient rises slowly from sitting to standing.  They walk without an antalgic gait.  There is no evidence of erythema, ecchymosis, or gross deformity.  There is tenderness to palpation over left lumbar paraspinal muscles without specific tenderness to palaption of the lumbar spine or SI joints.  Active ROM is limited due to pain.  Sensation to light touch is intact over all extremities.  Strength is symmetric and equal in all extremities.  Positive left straight leg raise.  Lymphadenopathy:    He has no cervical adenopathy.  Neurological: He is alert and oriented to person, place, and time.  Skin: Skin is warm and dry. He is not diaphoretic.  Psychiatric: He has a normal mood and affect. His behavior is normal. Judgment and thought content normal.  Nursing note and vitals reviewed.   Filed Vitals:   10/28/15 1029  BP: 132/70  Pulse: 87  Temp: 98.1 F (36.7 C)  Resp: 16         Assessment & Plan:    1. Concussion with loss of consciousness, 30 minutes or less, initial encounter -headaches noted -monitor for confusion, severe headaches or changes in vision -recommend stopping activity if there  is headache  2. Eyebrow laceration, left, initial encounter -too far out from injury to be able to place sutures -no evidence of infection -cleaned with hydrogen peroxide here  3. Midline low back pain with left-sided sciatica -appears to be healing.  No red flags for cauda equina -if not full improvement by 2 weeks needs to go to ortho -mobic daily -stop baclofen and hydrocodone -prednisone finished  4. Need for Tdap vaccination -updated today due to laceration over  eye

## 2015-11-23 ENCOUNTER — Other Ambulatory Visit: Payer: Self-pay | Admitting: Internal Medicine

## 2016-02-06 ENCOUNTER — Encounter: Payer: Self-pay | Admitting: Physician Assistant

## 2017-02-14 ENCOUNTER — Encounter: Payer: Self-pay | Admitting: Physician Assistant

## 2017-09-18 ENCOUNTER — Ambulatory Visit (HOSPITAL_COMMUNITY)
Admission: EM | Admit: 2017-09-18 | Discharge: 2017-09-18 | Disposition: A | Discharge status: Home / Self Care | Payer: 59 | Attending: Family Medicine | Admitting: Family Medicine

## 2017-09-18 ENCOUNTER — Encounter (HOSPITAL_COMMUNITY): Payer: Self-pay | Admitting: Emergency Medicine

## 2017-09-18 DIAGNOSIS — S91312A Laceration without foreign body, left foot, initial encounter: Secondary | ICD-10-CM | POA: Diagnosis not present

## 2017-09-18 DIAGNOSIS — L03116 Cellulitis of left lower limb: Secondary | ICD-10-CM | POA: Diagnosis not present

## 2017-09-18 MED ORDER — DOXYCYCLINE HYCLATE 100 MG PO TABS: 100.0000 mg | ORAL_TABLET | Freq: Two times a day (BID) | ORAL | 0 refills | Status: DC

## 2017-09-18 NOTE — ED Triage Notes (Signed)
Pt here for laceration to right foot onset 4 weeks ago  Sts he cut it w/a broken coffee cup  It was getting better but now has developed swelling, pain, redness and it is now "sipping" fluid  A&O x4... NAD... Ambulatory

## 2017-09-18 NOTE — ED Provider Notes (Signed)
[place]   [id number] 09/18/17 Arrival Time: 1203   SUBJECTIVE:  Victor Martin is a 51 y.o. male who presents to the urgent care with complaint of left foot laceration that occurred 4 weeks ago and became quite painful over the last 24 hours. 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 The problem site is at the base of the left lateral metatarsal.  Patient's last tetanus shot was 2 years ago     Past Medical History:  Diagnosis Date  . Anxiety   . Anxiety state, unspecified 06/26/2014  . Arthritis   . GERD (gastroesophageal reflux disease)   . Hyperlipemia    Family History  Problem Relation Age of Onset  . Hypertension Unknown   . Heart disease Unknown   . Diabetes Unknown   . Thyroid disease Unknown   . Hyperlipidemia Unknown   . Colon polyps Unknown    Social History   Social History  . Marital status: Single    Spouse name: N/A  . Number of children: N/A  . Years of education: N/A   Occupational History  . Not on file.   Social History Main Topics  . Smoking status: Current Every Day Smoker    Packs/day: 2.00    Types: Cigarettes  . Smokeless tobacco: Never Used  . Alcohol use Yes     Comment: rarely  . Drug use: No  . Sexual activity: Not on file   Other Topics Concern  . Not on file   Social History Narrative  . No narrative on file   No outpatient prescriptions have been marked as taking for the 09/18/17 encounter [facility] Encounter).   Allergies  Allergen Reactions  . Nicotine Itching    Nicotine patch      ROS: As per HPI, remainder of ROS negative.   OBJECTIVE:   Vitals:   09/18/17 1228  BP: 131/64  Pulse: 85  Resp: 14  Temp: 98.5 F (36.9 C)  TempSrc: Oral  SpO2: 100%     General  appearance: alert; no distress Eyes: PERRL; EOMI; conjunctiva normal HENT: normocephalic; atraumatic; TMs normal, canal normal, external ears normal without trauma; nasal mucosa normal; oral mucosa normal Neck: supple Lungs: clear to auscultation bilaterally Heart: regular rate and rhythm Abdomen: soft, non-tender; bowel sounds normal; no masses or organomegaly; no guarding or rebound tenderness Back: no CVA tenderness Extremities: no cyanosis or edema; symmetrical with no gross deformities Skin: warm and dry, erythematous, tender, mildly swollen partially healed laceration at the base of the fifth metatarsal Neurologic: normal gait; grossly normal Psychological: alert and cooperative; normal mood and affect      Labs:  Results for orders placed or performed in visit on 06/25/14  CBC with Differential  Result Value Ref Range   WBC 7.8 4.0 - 10.5 K/uL   RBC 4.78 4.22 - 5.81 MIL/uL   Hemoglobin 16.1 13.0 - 17.0 g/dL   HCT 46.2 39.0 - 52.0 %   MCV 96.7 78.0 - 100.0 fL   MCH 33.7 26.0 - 34.0 pg   MCHC 34.8 30.0 - 36.0 g/dL   RDW 13.8 11.5 - 15.5 %   Platelets 232 150 - 400 K/uL   Neutrophils Relative % 54 43 - 77 %  Neutro Abs 4.2 1.7 - 7.7 K/uL   Lymphocytes Relative 31 12 - 46 %   Lymphs Abs 2.4 0.7 - 4.0 K/uL   Monocytes Relative 10 3 - 12 %   Monocytes Absolute 0.8 0.1 - 1.0 K/uL   Eosinophils Relative 4 0 - 5 %   Eosinophils Absolute 0.3 0.0 - 0.7 K/uL   Basophils Relative 1 0 - 1 %   Basophils Absolute 0.1 0.0 - 0.1 K/uL   Smear Review Criteria for review not met   TSH  Result Value Ref Range   TSH 1.156 0.350 - 4.500 uIU/mL  Comprehensive metabolic panel  Result Value Ref Range   Sodium 139 135 - 145 mEq/L   Potassium 4.0 3.5 - 5.3 mEq/L   Chloride 104 96 - 112 mEq/L   CO2 25 19 - 32 mEq/L   Glucose, Bld 86 70 - 99 mg/dL   BUN 9 6 - 23 mg/dL   Creat 0.99 0.50 - 1.35 mg/dL   Total Bilirubin 0.5 0.2 - 1.2 mg/dL   Alkaline Phosphatase 84 39 - 117 U/L   AST 16  0 - 37 U/L   ALT 16 0 - 53 U/L   Total Protein 7.1 6.0 - 8.3 g/dL   Albumin 5.0 3.5 - 5.2 g/dL   Calcium 9.8 8.4 - 10.5 mg/dL    Labs Reviewed - No data to display  No results found.     ASSESSMENT & PLAN:  1. Foot laceration, left, initial encounter   2. Cellulitis of foot, left     Meds ordered this encounter  Medications  . doxycycline (VIBRA-TABS) 100 MG tablet    Sig: Take 1 tablet (100 mg total) by mouth 2 (two) times daily.    Dispense:  20 tablet    Refill:  0    Reviewed expectations re: course of current medical issues. Questions answered. Outlined signs and symptoms indicating need for more acute intervention. Patient verbalized understanding. After Visit Summary given.    Procedures:      Robyn Haber, MD 09/18/17 1238

## 2019-03-20 ENCOUNTER — Ambulatory Visit (INDEPENDENT_AMBULATORY_CARE_PROVIDER_SITE_OTHER): Payer: 59 | Admitting: Adult Health Nurse Practitioner

## 2019-03-20 ENCOUNTER — Other Ambulatory Visit: Payer: Self-pay

## 2019-03-20 ENCOUNTER — Encounter: Payer: Self-pay | Admitting: Adult Health Nurse Practitioner

## 2019-03-20 VITALS — BP 110/68 | HR 87 | Temp 97.7°F | Wt 159.0 lb

## 2019-03-20 DIAGNOSIS — Z114 Encounter for screening for human immunodeficiency virus [HIV]: Secondary | ICD-10-CM | POA: Diagnosis not present

## 2019-03-20 DIAGNOSIS — Z1159 Encounter for screening for other viral diseases: Secondary | ICD-10-CM | POA: Diagnosis not present

## 2019-03-20 DIAGNOSIS — L03818 Cellulitis of other sites: Secondary | ICD-10-CM

## 2019-03-20 DIAGNOSIS — F17219 Nicotine dependence, cigarettes, with unspecified nicotine-induced disorders: Secondary | ICD-10-CM | POA: Diagnosis not present

## 2019-03-20 DIAGNOSIS — T63304A Toxic effect of unspecified spider venom, undetermined, initial encounter: Secondary | ICD-10-CM

## 2019-03-20 DIAGNOSIS — Z0000 Encounter for general adult medical examination without abnormal findings: Secondary | ICD-10-CM

## 2019-03-20 MED ORDER — PREDNISONE 10 MG (21) PO TBPK: ORAL_TABLET | Freq: Every day | ORAL | 0 refills | Status: DC

## 2019-03-20 MED ORDER — CEPHALEXIN 500 MG PO CAPS: ORAL_CAPSULE | ORAL | 0 refills | Status: DC

## 2019-03-20 NOTE — Patient Instructions (Signed)
   We have sent in prednisone taper pack for you.  Start taking this today.  We will also send in Keflex, antibiotic for you to take.  Keep the area clean and dry.   We will check labs today and contact you with the results Monday.   Please call or return with new or worsening symptoms.   Spider Bite  Spider bites are not common. Most spider bites do not cause serious problems. There are only a few types of spider bites that can cause serious health problems. Follow these instructions at home: Medicine  Take or apply over-the-counter and prescription medicines only as told by your doctor.  If you were given an antibiotic medicine, take or apply it as told by your doctor. Do not stop using the antibiotic even if your condition improves. General instructions  Do not scratch the bite area.  Keep the bite area clean and dry. Wash the bite area with soap and water every day as told by your doctor.  If directed, apply ice to the bite area. ? Put ice in a plastic bag. ? Place a towel between your skin and the bag. ? Leave the ice on for 20 minutes, 2-3 times per day.  Raise (elevate) the affected area above the level of your heart while you are sitting or lying down, if this is possible.  Keep all follow-up visits as told by your doctor. This is important. Contact a doctor if:  Your bite does not get better after 3 days.  Your bite turns black or purple.  Near the bite, you have: ? Redness. ? Swelling (inflammation). ? Pain that is getting worse. Get help right away if:  You get shortness of breath or chest pain.  You have fluid, blood, or pus coming from the bite area.  You have muscle cramps or painful muscle spasms.  You have stomach (abdominal) pain.  You feel sick to your stomach (nauseous) or you throw up (vomit).  You feel more tired or sleepy than you normally do. This information is not intended to replace advice given to you by your health care provider.  Make sure you discuss any questions you have with your health care provider. Document Released: 01/15/2011 Document Revised: 08/09/2016 Document Reviewed: 04/30/2015 Elsevier Interactive Patient Education  2019 [employer].

## 2019-03-20 NOTE — Progress Notes (Signed)
Assessment and Plan:  Victor Martin was seen today for leg swelling.  Diagnoses and all orders for this visit:  Spider bite wound, undetermined intent, initial encounter -     predniSONE (STERAPRED UNI-PAK 21 TAB) 10 MG (21) TBPK tablet; Take by mouth daily. Follow directions on Taper pack  Cellulitis of other specified site -     cephALEXin (KEFLEX) 500 MG capsule; Take 1 capsule 4 x/day after meals & bedtime for infection Monitor site and redness, should started to decrease in next couple days.  Examination, routine, over 18 years of age -     CBC with Differential/Platelet -     COMPLETE METABOLIC PANEL WITH GFR -     Lipid panel -     Hemoglobin A1c -     Insulin, random -     VITAMIN D 25 Hydroxy (Vit-D Deficiency, Fractures) -     TSH  Need for hepatitis C screening test -     Hepatitis C antibody  Screening for HIV (human immunodeficiency virus) -     HIV Antibody (routine testing w rflx)  Cigarette nicotine dependence with nicotine-induced disorder Smokes a pack a day Not interested in quitting at this time Will continue to assess readiness.   Patient overdue for routine physical  Schedule follow up appointment in 6 months for complete physical.  Further disposition pending results of labs. Discussed med's effects and SE's.   Over 30 minutes of exam, counseling, chart review, and critical decision making was performed.   Future Appointments  Date Time Provider Department Center  09/21/2019  9:30 AM Elder Negus, NP [facility] None    ------------------------------------------------------------------------------------------------------------------   HPI 52 y.o.male presents for a spot on the back of left calf.  He noticed an itch on Saturday night. Reports that the next day it was more bothersome and painful. This has been progressively increasing in pain and itching.  He denies any trauma to the leg.  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 He reports that he can feel it when he is sitting but when he starts to walk that it really hurts.  He reports the longer on his feet the more painful it became.  He called out of work today related to the discomfort.  He put some cortisone cream on the spot and that did not help.  He reports that it is itchy around the area. He reports when he turns over in bed he wakes up from the pressure on the bedding.   Also reports that his pants rub on the area when walking that causes discomforts.    He is overdue for routine health screenings and complete physical.  Last labs were in 2015.      Past Medical History:  Diagnosis Date  . Anxiety   . Anxiety state, unspecified 06/26/2014  . Arthritis   . GERD (gastroesophageal reflux disease)   . Hyperlipemia      Allergies  Allergen Reactions  . Nicotine Itching    Nicotine patch    No current outpatient medications on file prior to visit.   No current facility-administered medications on file prior to visit.     ROS: Review of Systems  Constitutional: Negative for chills, diaphoresis, fever, malaise/fatigue and weight loss.  HENT: Negative for congestion, ear discharge, ear pain, hearing loss, nosebleeds, sinus pain and tinnitus.   Eyes: Negative for blurred vision, double vision, photophobia, pain, discharge and redness.  Respiratory: Negative  for cough, hemoptysis, sputum production, shortness of breath, wheezing and stridor.   Cardiovascular: Negative for chest pain, palpitations, orthopnea, leg swelling and PND.  Gastrointestinal: Negative for abdominal pain, blood in stool, constipation, diarrhea, heartburn, melena, nausea and vomiting.  Genitourinary: Negative for dysuria, flank pain, frequency, hematuria and urgency.  Musculoskeletal: Negative for back pain, falls, joint pain, myalgias and neck pain.  Skin: Positive for itching. Negative for rash.       Redness and itching to right posterior calf.   Neurological: Negative for dizziness, tingling, tremors, sensory change, speech change, focal weakness, seizures, loss of consciousness, weakness and headaches.  Endo/Heme/Allergies: Negative for environmental allergies and polydipsia. Does not bruise/bleed easily.  Psychiatric/Behavioral: Negative for depression, hallucinations, memory loss, substance abuse and suicidal ideas. The patient is not nervous/anxious and does not have insomnia.     Physical Exam:  BP 110/68   Pulse 87   Temp 97.7 F (36.5 C)   Wt 159 lb (72.1 kg)   SpO2 96%   BMI 22.49 kg/m   General Appearance: Well nourished, in no apparent distress. Eyes: PERRLA, EOMs, conjunctiva no swelling or erythema Respiratory: Respiratory effort normal, BS equal bilaterally without rales, rhonchi, wheezing or stridor.  Cardio: RRR with no MRGs. Brisk peripheral pulses without edema.  Abdomen: Soft, + BS.  Non tender, no guarding, rebound, hernias, masses. Musculoskeletal: Full ROM, 5/5 strength, normal gait.  Skin: Warm, dry without rashes, lesions, ecchymosis. Right posterior calf, dime size erythema noted, tenderness to touch.  Central raised blackhead.  Below lesion skin has mild erythema and warm to touch.  Excoriation noted from scratching area.  Small amount of purulent drainage expressed from central lesion. Neuro: Cranial nerves intact. Normal muscle tone, no cerebellar symptoms. Sensation intact.  Psych: Awake and oriented X 3, normal affect, Insight and Judgment appropriate.     Elder Negus, NP 12:02 PM [facility]

## 2019-03-21 LAB — COMPLETE METABOLIC PANEL WITH GFR
AG Ratio: 1.8 (calc) (ref 1.0–2.5)
ALT: 16 U/L (ref 9–46)
AST: 20 U/L (ref 10–35)
Albumin: 4.4 g/dL (ref 3.6–5.1)
Alkaline phosphatase (APISO): 68 U/L (ref 35–144)
BUN: 10 mg/dL (ref 7–25)
CO2: 25 mmol/L (ref 20–32)
Calcium: 9.2 mg/dL (ref 8.6–10.3)
Chloride: 107 mmol/L (ref 98–110)
Creat: 0.94 mg/dL (ref 0.70–1.33)
GFR, Est African American: 106 mL/min/{1.73_m2} (ref >=60)
GFR, Est Non African American: 92 mL/min/{1.73_m2} (ref >=60)
Globulin: 2.4 g/dL (calc) (ref 1.9–3.7)
Glucose, Bld: 86 mg/dL (ref 65–99)
Potassium: 4.3 mmol/L (ref 3.5–5.3)
Sodium: 140 mmol/L (ref 135–146)
Total Bilirubin: 0.4 mg/dL (ref 0.2–1.2)
Total Protein: 6.8 g/dL (ref 6.1–8.1)

## 2019-03-21 LAB — LIPID PANEL
Cholesterol: 169 mg/dL (ref <200)
HDL: 32 mg/dL — ABNORMAL LOW (ref >=40)
LDL Cholesterol (Calc): 105 mg/dL (calc) — ABNORMAL HIGH
Non-HDL Cholesterol (Calc): 137 mg/dL (calc) — ABNORMAL HIGH (ref <130)
Total CHOL/HDL Ratio: 5.3 (calc) — ABNORMAL HIGH (ref <5.0)
Triglycerides: 202 mg/dL — ABNORMAL HIGH (ref <150)

## 2019-03-21 LAB — VITAMIN D 25 HYDROXY (VIT D DEFICIENCY, FRACTURES): Vit D, 25-Hydroxy: 8 ng/mL — ABNORMAL LOW (ref 30–100)

## 2019-03-21 LAB — CBC WITH DIFFERENTIAL/PLATELET
Absolute Monocytes: 1086 cells/uL — ABNORMAL HIGH (ref 200–950)
Basophils Absolute: 68 cells/uL (ref 0–200)
Basophils Relative: 0.7 %
Eosinophils Absolute: 873 cells/uL — ABNORMAL HIGH (ref 15–500)
Eosinophils Relative: 9 %
HCT: 43 % (ref 38.5–50.0)
Hemoglobin: 14.9 g/dL (ref 13.2–17.1)
Lymphs Abs: 1513 cells/uL (ref 850–3900)
MCH: 33.4 pg — ABNORMAL HIGH (ref 27.0–33.0)
MCHC: 34.7 g/dL (ref 32.0–36.0)
MCV: 96.4 fL (ref 80.0–100.0)
MPV: 11.6 fL (ref 7.5–12.5)
Monocytes Relative: 11.2 %
Neutro Abs: 6160 cells/uL (ref 1500–7800)
Neutrophils Relative %: 63.5 %
Platelets: 239 10*3/uL (ref 140–400)
RBC: 4.46 10*6/uL (ref 4.20–5.80)
RDW: 12.5 % (ref 11.0–15.0)
Total Lymphocyte: 15.6 %
WBC: 9.7 10*3/uL (ref 3.8–10.8)

## 2019-03-21 LAB — TSH: TSH: 1.35 mIU/L (ref 0.40–4.50)

## 2019-03-21 LAB — HEMOGLOBIN A1C
Hgb A1c MFr Bld: 5.2 % of total Hgb (ref <5.7)
Mean Plasma Glucose: 103 (calc)
eAG (mmol/L): 5.7 (calc)

## 2019-03-21 LAB — INSULIN, RANDOM: Insulin: 4.4 u[IU]/mL

## 2019-03-21 LAB — HIV ANTIBODY (ROUTINE TESTING W REFLEX): HIV 1&2 Ab, 4th Generation: NONREACTIVE

## 2019-03-21 LAB — HEPATITIS C ANTIBODY
Hepatitis C Ab: NONREACTIVE
SIGNAL TO CUT-OFF: 0.43 (ref <1.00)

## 2019-03-22 ENCOUNTER — Other Ambulatory Visit: Payer: Self-pay | Admitting: Adult Health Nurse Practitioner

## 2019-03-22 DIAGNOSIS — E559 Vitamin D deficiency, unspecified: Secondary | ICD-10-CM

## 2019-03-22 MED ORDER — CHOLECALCIFEROL 1.25 MG (50000 UT) PO CAPS: ORAL_CAPSULE | ORAL | 0 refills | Status: DC

## 2019-03-22 NOTE — Progress Notes (Signed)
Vitamin D Defciency, sent in Rx.  Patient notified via voicemail.   Elder Negus, NP [facility] 03/22/2019  12:55 PM

## 2019-03-27 ENCOUNTER — Telehealth: Payer: 59 | Admitting: Adult Health Nurse Practitioner

## 2019-03-27 ENCOUNTER — Other Ambulatory Visit: Payer: Self-pay

## 2019-03-27 DIAGNOSIS — L03818 Cellulitis of other sites: Secondary | ICD-10-CM

## 2019-03-27 DIAGNOSIS — T63304A Toxic effect of unspecified spider venom, undetermined, initial encounter: Secondary | ICD-10-CM

## 2019-03-27 DIAGNOSIS — E559 Vitamin D deficiency, unspecified: Secondary | ICD-10-CM

## 2019-03-27 MED ORDER — DOXYCYCLINE HYCLATE 100 MG PO TABS: 100.0000 mg | ORAL_TABLET | Freq: Two times a day (BID) | ORAL | 0 refills | Status: AC

## 2019-03-27 MED ORDER — DOXYCYCLINE HYCLATE 100 MG PO TABS: 100.0000 mg | ORAL_TABLET | Freq: Two times a day (BID) | ORAL | 0 refills | Status: DC

## 2019-03-27 MED ORDER — CHOLECALCIFEROL 1.25 MG (50000 UT) PO CAPS: ORAL_CAPSULE | ORAL | 0 refills | Status: DC

## 2019-03-27 NOTE — Progress Notes (Signed)
Doxycycline and Vitamin D prescriptions sent to the wrong pharmacy.  Resent prescriptions and [facility] called and cancelled rx

## 2019-05-01 ENCOUNTER — Ambulatory Visit (INDEPENDENT_AMBULATORY_CARE_PROVIDER_SITE_OTHER): Payer: 59 | Admitting: Physician Assistant

## 2019-05-01 ENCOUNTER — Encounter: Payer: Self-pay | Admitting: Physician Assistant

## 2019-05-01 ENCOUNTER — Other Ambulatory Visit: Payer: Self-pay

## 2019-05-01 VITALS — BP 122/88 | HR 101 | Temp 98.1°F | Wt 156.0 lb

## 2019-05-01 DIAGNOSIS — G8929 Other chronic pain: Secondary | ICD-10-CM | POA: Diagnosis not present

## 2019-05-01 DIAGNOSIS — M5442 Lumbago with sciatica, left side: Secondary | ICD-10-CM | POA: Diagnosis not present

## 2019-05-01 MED ORDER — TRAMADOL HCL 50 MG PO TABS: 50.0000 mg | ORAL_TABLET | Freq: Two times a day (BID) | ORAL | 0 refills | Status: AC | PRN

## 2019-05-01 MED ORDER — MELOXICAM 15 MG PO TABS: ORAL_TABLET | ORAL | 1 refills | Status: DC

## 2019-05-01 NOTE — Progress Notes (Signed)
Subjective:    Patient ID: Victor Martin, male    DOB: [birth year], 53 y.o.   MRN: [id number]  HPI 53 y.o. WM with history of lumbar fracture 30 years ago and has had back pain since then. He states from time to time he will get a "pop" in his back and get fire hot pain and he can not walk during that time.  He states yesterday he was at work, started to have some initial pain, felt a small pop on the left and started to have pain, so he went home from work. He has been laying on the floor with his legs propped on his couch and this helps. He has some pain down his lateral leg to his knee.   He states he did not do well with muscle relaxer or percocet in the past, made him sick.  Patient denies fever, hematuria, incontinence, numbness, tingling, weakness and saddle anesthesia   Imaging 25 years ago +  Blood pressure 122/88, pulse (!) 101, temperature 98.1 F (36.7 C), weight 156 lb (70.8 kg), SpO2 98 %.  Current Outpatient Medications on File Prior to Visit  Medication Sig Dispense Refill  . Cholecalciferol 1.25 MG (50000 UT) capsule Take one tablet by mouth three days a week for twelve weeks. 36 capsule 0   No current facility-administered medications on file prior to visit.     Past Medical History:  Diagnosis Date  . Anxiety   . Anxiety state, unspecified 06/26/2014  . Arthritis   . GERD (gastroesophageal reflux disease)   . Hyperlipemia     Review of Systems  Constitutional: Negative.  Negative for chills and fever.  HENT: Negative.   Respiratory: Negative.   Cardiovascular: Negative.   Gastrointestinal: Negative.   Genitourinary: Negative.  Negative for difficulty urinating.  Musculoskeletal: Positive for back pain and gait problem. Negative for arthralgias, joint swelling, myalgias, neck pain and neck stiffness.  Skin: Negative.  Negative for rash.       Objective:   Physical Exam  Constitutional: He is oriented to person, place, and time. He appears  well-developed and well-nourished.  HENT:  Head: Normocephalic and atraumatic.  Cardiovascular: Normal rate and regular rhythm.  Pulmonary/Chest: Effort normal and breath sounds normal.  Abdominal: Soft. Bowel sounds are normal. There is no abdominal tenderness.  Musculoskeletal:     Comments: Patient is able to ambulate well. Gait is  Antalgic. Straight leg raising with dorsiflexion positive bilaterally for radicular symptoms. Sensory exam in the legs are normal. Knee reflexes are normal Ankle reflexes are normal Strength is normal and symmetric in arms and legs. There is not SI tenderness to palpation.  There isparaspinal muscle spasm.  There is not midline tenderness.  ROM of spine with  limited in all spheres due to pain.   Neurological: He is alert and oriented to person, place, and time. He has normal reflexes.  Skin: No rash noted.       Assessment & Plan:  Lower back pain- + straight leg   mobic x 2 weeks and tramadol small amount, RICE, and exercise given If not better follow up in office or will refer to PT/orthopedics. Natural history and expected course discussed. Questions answered. Proper lifting, bending technique discussed. Short (2-4 day) period of relative rest recommended until acute symptoms improve. Heat to affected area as needed for local pain relief. Go to the ER if you have any new weakness in your legs, have trouble controlling your urine or bowels, or  have worsening pain.   Future Appointments  Date Time Provider Department Center  09/21/2019  9:30 AM Elder NegusMcClanahan, Kyra, NP [facility] None

## 2019-05-01 NOTE — Patient Instructions (Addendum)
INFORMATION ABOUT YOUR XRAY  Can walk into [address] building for an Personal assistant. They will have the order and take you back. You do not any paper work, I should get the result back today or tomorrow. This order is good for a year.   Low back pain  Ice low back for today  Then change to heat or do warm shower may help also  We will send in mobic and tramadol which is a milder pain medications Mobic is an antiinflammatory It helps pain, can not take with aleve, or ibuprofen You can take tylenol (500mg ) or tylenol arthritis (650mg ) with the meloxicam/antiinflammatories. The max you can take of tylenol a day is 3000mg  daily, this is a max of 6 pills a day of the regular tyelnol (500mg ) or a max of 4 a day of the tylenol arthritis (650mg ) as long as no other medications you are taking contain tylenol.   Mobic can cause inflammation in your stomach and can cause ulcers or bleeding, this will look like black tarry stools Make sure you take your mobic with food Try not to take it daily, take AS needed Can take with zantac  If not improvement after 7-10 days please contact office and we will send you to an ortho  Please get the xray of your lower back  I suggest we get a physical on you eventually  Once improved, then work on exercise to strengthen this area.  Below are examples and you can find them on-line as well.  Use reputable sites from physical therapy or from universities.

## 2019-06-16 ENCOUNTER — Other Ambulatory Visit: Payer: Self-pay | Admitting: Adult Health Nurse Practitioner

## 2019-06-16 DIAGNOSIS — E559 Vitamin D deficiency, unspecified: Secondary | ICD-10-CM

## 2019-09-21 ENCOUNTER — Encounter: Payer: 59 | Admitting: Adult Health Nurse Practitioner

## 2020-03-13 ENCOUNTER — Observation Stay (HOSPITAL_COMMUNITY)
Admission: EM | Admit: 2020-03-13 | Discharge: 2020-03-14 | Disposition: A | Discharge status: Home / Self Care | Payer: 59 | Attending: Cardiology | Admitting: Cardiology

## 2020-03-13 ENCOUNTER — Encounter (HOSPITAL_COMMUNITY): Payer: Self-pay

## 2020-03-13 ENCOUNTER — Other Ambulatory Visit: Payer: Self-pay

## 2020-03-13 ENCOUNTER — Emergency Department (HOSPITAL_COMMUNITY): Payer: 59

## 2020-03-13 DIAGNOSIS — R001 Bradycardia, unspecified: Secondary | ICD-10-CM | POA: Diagnosis not present

## 2020-03-13 DIAGNOSIS — Z20822 Contact with and (suspected) exposure to covid-19: Secondary | ICD-10-CM | POA: Insufficient documentation

## 2020-03-13 DIAGNOSIS — Z8349 Family history of other endocrine, nutritional and metabolic diseases: Secondary | ICD-10-CM | POA: Diagnosis not present

## 2020-03-13 DIAGNOSIS — I208 Other forms of angina pectoris: Secondary | ICD-10-CM | POA: Diagnosis not present

## 2020-03-13 DIAGNOSIS — F17210 Nicotine dependence, cigarettes, uncomplicated: Secondary | ICD-10-CM | POA: Insufficient documentation

## 2020-03-13 DIAGNOSIS — R072 Precordial pain: Secondary | ICD-10-CM

## 2020-03-13 DIAGNOSIS — K219 Gastro-esophageal reflux disease without esophagitis: Secondary | ICD-10-CM | POA: Insufficient documentation

## 2020-03-13 DIAGNOSIS — R079 Chest pain, unspecified: Principal | ICD-10-CM | POA: Diagnosis present

## 2020-03-13 DIAGNOSIS — E7800 Pure hypercholesterolemia, unspecified: Secondary | ICD-10-CM | POA: Diagnosis not present

## 2020-03-13 DIAGNOSIS — Z79899 Other long term (current) drug therapy: Secondary | ICD-10-CM | POA: Diagnosis not present

## 2020-03-13 DIAGNOSIS — Z720 Tobacco use: Secondary | ICD-10-CM | POA: Diagnosis not present

## 2020-03-13 DIAGNOSIS — I2510 Atherosclerotic heart disease of native coronary artery without angina pectoris: Secondary | ICD-10-CM | POA: Insufficient documentation

## 2020-03-13 DIAGNOSIS — E785 Hyperlipidemia, unspecified: Secondary | ICD-10-CM | POA: Insufficient documentation

## 2020-03-13 LAB — CBC WITH DIFFERENTIAL/PLATELET
Abs Immature Granulocytes: 0.02 10*3/uL (ref 0.00–0.07)
Basophils Absolute: 0.1 10*3/uL (ref 0.0–0.1)
Basophils Relative: 1 %
Eosinophils Absolute: 0.8 10*3/uL — ABNORMAL HIGH (ref 0.0–0.5)
Eosinophils Relative: 9 %
HCT: 44.1 % (ref 39.0–52.0)
Hemoglobin: 14.8 g/dL (ref 13.0–17.0)
Immature Granulocytes: 0 %
Lymphocytes Relative: 25 %
Lymphs Abs: 2.1 10*3/uL (ref 0.7–4.0)
MCH: 33.3 pg (ref 26.0–34.0)
MCHC: 33.6 g/dL (ref 30.0–36.0)
MCV: 99.3 fL (ref 80.0–100.0)
Monocytes Absolute: 0.8 10*3/uL (ref 0.1–1.0)
Monocytes Relative: 9 %
Neutro Abs: 4.7 10*3/uL (ref 1.7–7.7)
Neutrophils Relative %: 56 %
Platelets: 235 10*3/uL (ref 150–400)
RBC: 4.44 MIL/uL (ref 4.22–5.81)
RDW: 13 % (ref 11.5–15.5)
WBC: 8.5 10*3/uL (ref 4.0–10.5)
nRBC: 0 % (ref 0.0–0.2)

## 2020-03-13 LAB — CBC
HCT: 42.1 % (ref 39.0–52.0)
Hemoglobin: 14.2 g/dL (ref 13.0–17.0)
MCH: 33.3 pg (ref 26.0–34.0)
MCHC: 33.7 g/dL (ref 30.0–36.0)
MCV: 98.6 fL (ref 80.0–100.0)
Platelets: 219 10*3/uL (ref 150–400)
RBC: 4.27 MIL/uL (ref 4.22–5.81)
RDW: 13 % (ref 11.5–15.5)
WBC: 9 10*3/uL (ref 4.0–10.5)
nRBC: 0 % (ref 0.0–0.2)

## 2020-03-13 LAB — TROPONIN I (HIGH SENSITIVITY)
Troponin I (High Sensitivity): 3 ng/L (ref <18)
Troponin I (High Sensitivity): <2 ng/L (ref <18)

## 2020-03-13 LAB — CREATININE, SERUM
Creatinine, Ser: 0.85 mg/dL (ref 0.61–1.24)
GFR calc Af Amer: >60 mL/min (ref >60)
GFR calc non Af Amer: >60 mL/min (ref >60)

## 2020-03-13 LAB — POC SARS CORONAVIRUS 2 AG -  ED: SARS Coronavirus 2 Ag: NEGATIVE

## 2020-03-13 LAB — BASIC METABOLIC PANEL
Anion gap: 11 (ref 5–15)
BUN: 11 mg/dL (ref 6–20)
CO2: 22 mmol/L (ref 22–32)
Calcium: 9.1 mg/dL (ref 8.9–10.3)
Chloride: 105 mmol/L (ref 98–111)
Creatinine, Ser: 1.01 mg/dL (ref 0.61–1.24)
GFR calc Af Amer: >60 mL/min (ref >60)
GFR calc non Af Amer: >60 mL/min (ref >60)
Glucose, Bld: 125 mg/dL — ABNORMAL HIGH (ref 70–99)
Potassium: 3.6 mmol/L (ref 3.5–5.1)
Sodium: 138 mmol/L (ref 135–145)

## 2020-03-13 LAB — LIPID PANEL
Cholesterol: 167 mg/dL (ref 0–200)
HDL: 27 mg/dL — ABNORMAL LOW (ref >40)
LDL Cholesterol: 102 mg/dL — ABNORMAL HIGH (ref 0–99)
Total CHOL/HDL Ratio: 6.2 RATIO
Triglycerides: 189 mg/dL — ABNORMAL HIGH (ref <150)
VLDL: 38 mg/dL (ref 0–40)

## 2020-03-13 LAB — HEMOGLOBIN A1C
Hgb A1c MFr Bld: 5.4 % (ref 4.8–5.6)
Mean Plasma Glucose: 108.28 mg/dL

## 2020-03-13 LAB — HEPARIN LEVEL (UNFRACTIONATED): Heparin Unfractionated: 0.34 IU/mL (ref 0.30–0.70)

## 2020-03-13 MED ORDER — NITROGLYCERIN 0.4 MG SL SUBL: 0.4000 mg | SUBLINGUAL_TABLET | SUBLINGUAL | Status: DC | PRN

## 2020-03-13 MED ORDER — ASPIRIN 300 MG RE SUPP: 300.0000 mg | RECTAL | Status: DC

## 2020-03-13 MED ORDER — ASPIRIN 81 MG PO CHEW: 324.0000 mg | CHEWABLE_TABLET | ORAL | Status: DC

## 2020-03-13 MED ORDER — ASPIRIN EC 81 MG PO TBEC
81.0000 mg | DELAYED_RELEASE_TABLET | Freq: Every day | ORAL | Status: DC
  Administered 2020-03-14: 81 mg via ORAL

## 2020-03-13 MED ORDER — NITROGLYCERIN 0.4 MG SL SUBL
0.4000 mg | SUBLINGUAL_TABLET | SUBLINGUAL | Status: DC | PRN
  Administered 2020-03-13: 13:00:00 0.4 mg via SUBLINGUAL
  Filled 2020-03-13: qty 1

## 2020-03-13 MED ORDER — HEPARIN SODIUM (PORCINE) 5000 UNIT/ML IJ SOLN
5000.0000 [IU] | Freq: Three times a day (TID) | INTRAMUSCULAR | Status: DC
  Administered 2020-03-13: 5000 [IU] via SUBCUTANEOUS
  Filled 2020-03-13: qty 1

## 2020-03-13 MED ORDER — SODIUM CHLORIDE 0.9% FLUSH: 3.0000 mL | Freq: Two times a day (BID) | INTRAVENOUS | Status: DC

## 2020-03-13 MED ORDER — ONDANSETRON HCL 4 MG/2ML IJ SOLN: 4.0000 mg | Freq: Four times a day (QID) | INTRAMUSCULAR | Status: DC | PRN

## 2020-03-13 MED ORDER — HEPARIN (PORCINE) 25000 UT/250ML-% IV SOLN
900.0000 [IU]/h | INTRAVENOUS | Status: DC
  Administered 2020-03-13: 900 [IU]/h via INTRAVENOUS
  Filled 2020-03-13: qty 250

## 2020-03-13 MED ORDER — ASPIRIN 81 MG PO CHEW
324.0000 mg | CHEWABLE_TABLET | Freq: Once | ORAL | Status: AC
  Administered 2020-03-13: 324 mg via ORAL
  Filled 2020-03-13: qty 4

## 2020-03-13 MED ORDER — ACETAMINOPHEN 325 MG PO TABS: 650.0000 mg | ORAL_TABLET | ORAL | Status: DC | PRN

## 2020-03-13 NOTE — Progress Notes (Signed)
ANTICOAGULATION CONSULT NOTE - Initial Consult  Pharmacy Consult for Heparin Indication: chest pain/ACS  Allergies  Allergen Reactions  . Nicotine Itching    Nicotine patch    Patient Measurements: Height: 5\' 10"  (177.8 cm) Weight: 156 lb 8.4 oz (71 kg) IBW/kg (Calculated) : 73 Heparin Dosing Weight: 74.8 kg  Vital Signs: Temp: 98.1 F (36.7 C) (03/18 1714) Temp Source: Oral (03/18 1714) BP: 124/78 (03/18 1714) Pulse Rate: 63 (03/18 1714)  Labs: Recent Labs    03/13/20 1137 03/13/20 1405  HGB 14.8  --   HCT 44.1  --   PLT 235  --   CREATININE 1.01  --   TROPONINIHS 3 <2    Estimated Creatinine Clearance: 83 mL/min (by C-G formula based on SCr of 1.01 mg/dL).   Medical History: Past Medical History:  Diagnosis Date  . Anxiety   . Anxiety state, unspecified 06/26/2014  . Arthritis   . GERD (gastroesophageal reflux disease)   . Hyperlipemia     Assessment: 54 yr old man admitted with chest pain; troponin found to be 3 with repeat <2; ECG is stable and without acute changes, but symptoms are worrisome for CV etiology. LHC planned for further CV evaluation. Pharmacy is consulted to dose heparin. Pt was on no anticoagulants prior to admission.  CBC WNL; pt rec'd heparin 5000 units SQ at 16:52 PM this afternoon.  Goal of Therapy:  Heparin level 0.3-0.7 units/ml Monitor platelets by anticoagulation protocol: Yes   Plan:  Heparin infusion at 900 units/hr (no bolus since pt rec'd dose of SQ heparin 5000 units this afternoon) Check heparin level 6 hours after starting heparin infusion Monitor daily heparin level, CBC Monitor for signs/symptoms of bleeding  51, PharmD, BCPS, [facility] Clinical Pharmacist 03/13/2020,5:16 PM

## 2020-03-13 NOTE — H&P (Signed)
History & Physical    Patient ID: Victor Martin MRN: [id number], DOB/AGE: [birth year]   Admit date: 03/13/2020 Primary Physician: Lucky Cowboy, MD Primary Cardiologist: New to [facility]  Patient Profile    [name] is a  54yo M with no known past medical hx who presented to the ED 03/13/20 after a 4 day hx of chest pain.   Past Medical History   Past Medical History:  Diagnosis Date  . Anxiety   . Anxiety state, unspecified 06/26/2014  . Arthritis   . GERD (gastroesophageal reflux disease)   . Hyperlipemia     Past Surgical History:  Procedure Laterality Date  . LEG SURGERY     steel rod in left leg    Allergies  Allergies  Allergen Reactions  . Nicotine Itching    Nicotine patch   History of Present Illness    [name] has a hx as stated above who presented to the ED on 03/13/20 with c/o chest pain which began Monday. 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 He was in his usual state of health when he began having chest pain with walking into his job. He reports the distance is about 178ft and would normally be very easy for him. He states that the pain would lessen with rest. He reports that his symptoms have been fairly constant since Monday. Also states that when he walks his dog, this toto has become more tiresome and makes his symptoms worse. He denies diaphoresis, SOB, LE edema, orthopnea, palpitations, dizziness or syncope. His father is with him today and states that he and the patients mother have undergone CABG. Patient also has a 1ppd hx of smoking for the last 30-40 years. He denies illicit drug or alcohol use.   In the ED, HS troponin found to be 3 with a repeat of <2, not consistent with ACS. EKG is stable without acute changes however symptoms are worrisome for CV etiology.   He was last seen by our service in 2013 at which time he underwent a myocardial perfusion study which had no significant  findings. He has not been seen since that time.   Home Medications    Prior to Admission medications   Medication Sig Start Date End Date Taking? Authorizing Provider  Cholecalciferol (VITAMIN D3) 1.25 MG (50000 UT) CAPS Take 1 capsule 3 x week on Mon wed & Fri 06/16/19   Lucky Cowboy, MD  meloxicam [facility]) 15 MG tablet Take one daily with food for 2 weeks, can take with tylenol, can not take with aleve, iburpofen, then as needed daily for pain 05/01/19   Quentin Mulling, PA-C    Family History    Family History  Problem Relation Age of Onset  . Hypertension Other   . Heart disease Other   . Diabetes Other   . Thyroid disease Other   . Hyperlipidemia Other   . Colon polyps Other     Social History    Social History   Socioeconomic History  . Marital status: Single    Spouse name: Not on file  . Number of children: Not on file  . Years of education: Not on file  . Highest education level: Not on file  Occupational History  . Not on file  Tobacco Use  . Smoking status: Current Every Day Smoker    Packs/day: 2.00    Types: Cigarettes  . Smokeless tobacco: Never Used  Substance and Sexual Activity  . Alcohol use: Yes    Comment: rarely  . Drug use: No  . Sexual activity: Not on file  Other Topics Concern  . Not on file  Social History Narrative  . Not on file   Social Determinants of Health   Financial Resource Strain:   . Difficulty of Paying Living Expenses:   Food Insecurity:   . Worried About Charity fundraiser in the Last Year:   . Arboriculturist in the Last Year:   Transportation Needs:   . Film/video editor (Medical):   Marland Kitchen Lack of Transportation (Non-Medical):   Physical Activity:   . Days of Exercise per Week:   . Minutes of Exercise per Session:   Stress:   . Feeling of Stress :   Social Connections:   . Frequency of Communication with Friends and Family:   . Frequency of Social Gatherings with Friends and Family:   . Attends Religious  Services:   . Active Member of Clubs or Organizations:   . Attends Archivist Meetings:   Marland Kitchen Marital Status:   Intimate Partner Violence:   . Fear of Current or Ex-Partner:   . Emotionally Abused:   Marland Kitchen Physically Abused:   . Sexually Abused:      Review of Systems   General: Denies fevers, chills, myalgias, decreased appetite and fatigue. Admits to good overall health. Cardiovascular: + chest pain. Denies palpitations, and SOB at rest or with exertion. Denies lower extremity swelling.  Respiratory- Denies SOB, wheezing, coughing. Denies the need for extra pillows for sleep at night.  Gastrointestinal: Denies abdominal, epigastric pain, nausea and vomiting.  Neurological: Denies syncope, UE numbness and tingling. No changes in mental status. Denies abnormal anxiousness or restlessness.  All other systems reviewed and are otherwise negative except as noted above.  Physical Exam    Blood pressure 107/62, pulse 63, temperature 98.1 F (36.7 C), temperature source Oral, resp. rate 15, height 5\' 10"  (1.778 m), weight 74.8 kg, SpO2 96 %.   General: Well developed, well nourished, NAD Neck: Negative for carotid bruits. No JVD Lungs:Clear to ausculation bilaterally. No wheezes, rales, or rhonchi. Breathing is unlabored. Cardiovascular: RRR with S1 S2. No murmurs Abdomen: Soft, non-tender, non-distended. No obvious abdominal masses. Extremities: No edema. Radial/DP/PT pulses 2+ bilaterally Neuro: Alert and oriented. No focal deficits. No facial asymmetry. MAE spontaneously. Psych: Responds to questions appropriately with normal affect.    Labs    Troponin (Point of Care Test) No results for input(s): TROPIPOC in the last 72 hours. No results for input(s): CKTOTAL, CKMB, TROPONINI in the last 72 hours. Lab Results  Component Value Date   WBC 8.5 03/13/2020   HGB 14.8 03/13/2020   HCT 44.1 03/13/2020   MCV 99.3 03/13/2020   PLT 235 03/13/2020    Recent Labs  Lab  03/13/20 1137  NA 138  K 3.6  CL 105  CO2 22  BUN 11  CREATININE 1.01  CALCIUM 9.1  GLUCOSE 125*   Lab Results  Component Value Date   CHOL 169 03/20/2019   HDL 32 (L) 03/20/2019   LDLCALC 105 (H) 03/20/2019   TRIG 202 (H) 03/20/2019   No results found for: [facility]   Radiology Studies    DG Chest Portable 1 View  Result Date: 03/13/2020 CLINICAL DATA:  Chest pain. EXAM: PORTABLE CHEST 1 VIEW COMPARISON:  04/10/2012 FINDINGS: The heart size and mediastinal contours are within normal limits. Both lungs are  clear. The visualized skeletal structures are unremarkable. IMPRESSION: Normal exam. Electronically Signed   By: Francene Boyers M.D.   On: 03/13/2020 11:36   ECG & Cardiac Imaging    03/13/20 EKG: NSR with HR 93bpm with mild ST depression in V4-V5.   Myocardial stress test 12/11/2012:  1.      Normal study. No evidence of ischemia or infarct.  2.      Normal LV systolic function with ejection fraction of 72%.  3.      Good exercise capacity and negative treadmill stress test.   Assessment & Plan    1. Chest pain: -Presented with typical anginal symptoms which began Monday. 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 He was in his usual state of health prior to Monday when he began having chest pain with walking into his job site. He reports the distance is about 166ft and would normally be very easy for him>>pain would intensify with exertion and lessen with rest. Symptoms have been fairly constant since Monday. Denies associated diaphoresis, SOB, LE edema, orthopnea, palpitations, dizziness or syncope.  -Has family hx of CAD in his father and mother who both have undergone CABG in their 33's.  -Also reports 1ppd hx of smoking for the last 30-40 years. -HS troponin found to be 3 with a repeat of <2, not consistent with ACS.  -EKG is stable without acute changes however symptoms are worrisome for CV etiology.  -CXR without  cardiopulmonary disease  -Given CRF's including tobacco use and family hx of CAD along with typical anginal symptoms, will plan for an LHC for further CV evaluation  -Creatinine stable at 1.01 -Will risk stratify with lipid panel and HbA1c -Start ASA  2. Tobacco Use: -Ongoing 1PPD use for 30-40 years -Has never tried quitting however reports he is willing -Place Nicotine patch while admitted    Severity of Illness: The appropriate patient status for this patient is OBSERVATION. Observation status is judged to be reasonable and necessary in order to provide the required intensity of service to ensure the patient's safety. The patient's presenting symptoms, physical exam findings, and initial radiographic and laboratory data in the context of their medical condition is felt to place them at decreased risk for further clinical deterioration. Furthermore, it is anticipated that the patient will be medically stable for discharge from the hospital within 2 midnights of admission. The following factors support the patient status of observation.   " The patient's presenting symptoms include chest pain . " The physical exam findings include chest pain. " The initial radiographic and laboratory data are mildly abnormal EKG.    Signed, Georgie Chard NP-C [facility] Pager: [phone] 03/13/2020, @NOW

## 2020-03-13 NOTE — ED Triage Notes (Signed)
Pt reports 2 days of left sided chest tightness and SOB. No other symptoms. Resp e.u at this time.

## 2020-03-13 NOTE — ED Provider Notes (Signed)
[facility] EMERGENCY DEPARTMENT Provider Note   CSN: [id number] Arrival date & time: 03/13/20  1059    History Chief Complaint  Patient presents with  . Chest Pain  . Shortness of Breath    Victor Martin is a 54 y.o. male with past medical history significant for Hyperlipidemia, GERD who presents for evaluation of chest pain. Pain located to Left upper chest. Pain currently 3/10. Admits to associated SOB.  Patient states pain is worse with exertion.  Relieved with rest.  Describes his pain as a tightness to his left upper chest.  Patient states yesterday evening he had to walk a distance at work.  He became extremely short of breath with labored breathing.  His pain was an 8/10.  States he became dizzy and started to sweat.  Patient states he rested and this resolved his pain.  History of hyperlipidemia however no hypertension.  Denies headache, paresthesias, weakness, PND, orthopnea, abdominal pain, nausea, vomiting, unilateral leg swelling, redness or warmth.  States he just does not "feel right."  Feels very fatigued.  Denies any recent Covid exposures.  No cough, hemoptysis, hx of PE, DVT, Aneurysm .  States he did have a Myocardia perfusion scan in 2013 which did not show any significant findings. Is not followed by Cardiology. Denies additional aggravating or alleviating factors. No palpitations, leg swelling.   Current everyday tobacco users  Mother and father with CAD requiring CABG procedures in the early 44s. History obtained from patient and past medical records.  No interpreter is used.    HPI     Past Medical History:  Diagnosis Date  . Anxiety   . Anxiety state, unspecified 06/26/2014  . Arthritis   . GERD (gastroesophageal reflux disease)   . Hyperlipemia     Patient Active Problem List   Diagnosis Date Noted  . Chest pain 03/13/2020  . Anxiety state, unspecified 06/26/2014    Past Surgical History:  Procedure Laterality Date  . LEG  SURGERY     steel rod in left leg       Family History  Problem Relation Age of Onset  . Hypertension Other   . Heart disease Other   . Diabetes Other   . Thyroid disease Other   . Hyperlipidemia Other   . Colon polyps Other     Social History   Tobacco Use  . Smoking status: Current Every Day Smoker    Packs/day: 2.00    Types: Cigarettes  . Smokeless tobacco: Never Used  Substance Use Topics  . Alcohol use: Yes    Comment: rarely  . Drug use: No    Home Medications Prior to Admission medications   Medication Sig Start Date End Date Taking? Authorizing Provider  Cholecalciferol (VITAMIN D3) 1.25 MG (50000 UT) CAPS Take 1 capsule 3 x week on Mon wed & Fri 06/16/19   Lucky Cowboy, MD  meloxicam [facility]) 15 MG tablet Take one daily with food for 2 weeks, can take with tylenol, can not take with aleve, iburpofen, then as needed daily for pain 05/01/19   Quentin Mulling, PA-C    Allergies    Nicotine  Review of Systems   Review of Systems  Constitutional: Positive for fatigue. Negative for activity change, appetite change, chills, diaphoresis, fever and unexpected weight change.  HENT: Negative.   Respiratory: Positive for shortness of breath. Negative for apnea, cough, choking, chest tightness, wheezing and stridor.   Cardiovascular: Positive for chest pain. Negative for palpitations and  leg swelling.  Gastrointestinal: Negative.   Genitourinary: Negative.   Musculoskeletal: Negative.   Skin: Negative.   Neurological: Negative.   All other systems reviewed and are negative.  Physical Exam Updated Vital Signs BP 118/66   Pulse 68   Temp 98.1 F (36.7 C) (Oral)   Resp 13   Ht 5\' 10"  (1.778 m)   Wt 74.8 kg   SpO2 95%   BMI 23.68 kg/m   Physical Exam Vitals and nursing note reviewed.  Constitutional:      General: He is not in acute distress.    Appearance: He is well-developed. He is not ill-appearing, toxic-appearing or diaphoretic.  HENT:     Head:  Normocephalic and atraumatic.     Jaw: There is normal jaw occlusion.     Nose: Nose normal.  Eyes:     Extraocular Movements: Extraocular movements intact.     Pupils: Pupils are equal, round, and reactive to light.  Neck:     Vascular: No carotid bruit or JVD.     Trachea: Trachea and phonation normal.  Cardiovascular:     Rate and Rhythm: Normal rate and regular rhythm.     Pulses: Normal pulses.          Radial pulses are 2+ on the right side and 2+ on the left side.       Posterior tibial pulses are 2+ on the right side and 2+ on the left side.     Heart sounds: Normal heart sounds.  Pulmonary:     Effort: Pulmonary effort is normal. No respiratory distress.     Breath sounds: Normal breath sounds and air entry.  Chest:     Chest wall: No deformity, swelling, tenderness, crepitus or edema.  Abdominal:     General: There is no distension.     Palpations: Abdomen is soft.  Musculoskeletal:        General: Normal range of motion.     Cervical back: Full passive range of motion without pain, normal range of motion and neck supple.     Right lower leg: No tenderness. No edema.     Left lower leg: No tenderness. No edema.     Comments: Compartments soft. Moves all 4 extremities without difficulty.  Feet:     Right foot:     Skin integrity: Skin integrity normal.     Left foot:     Skin integrity: Skin integrity normal.  Skin:    General: Skin is warm and dry.     Capillary Refill: Capillary refill takes less than 2 seconds.     Comments: No edema, erythema, warmth.  Neurological:     General: No focal deficit present.     Mental Status: He is alert and oriented to person, place, and time.    ED Results / Procedures / Treatments   Labs (all labs ordered are listed, but only abnormal results are displayed) Labs Reviewed  CBC WITH DIFFERENTIAL/PLATELET - Abnormal; Notable for the following components:      Result Value   Eosinophils Absolute 0.8 (*)    All other  components within normal limits  BASIC METABOLIC PANEL - Abnormal; Notable for the following components:   Glucose, Bld 125 (*)    All other components within normal limits  POC SARS CORONAVIRUS 2 AG -  ED  TROPONIN I (HIGH SENSITIVITY)  TROPONIN I (HIGH SENSITIVITY)    EKG EKG Interpretation  Date/Time:  Thursday March 13 2020 10:58:53 EDT Ventricular  Rate:  93 PR Interval:  134 QRS Duration: 90 QT Interval:  354 QTC Calculation: 440 R Axis:   75 Text Interpretation: Normal sinus rhythm Borderline T wave abnormality Confirmed by Geoffery Lyons ([id number]) on 03/13/2020 11:12:16 AM   Radiology DG Chest Portable 1 View  Result Date: 03/13/2020 CLINICAL DATA:  Chest pain. EXAM: PORTABLE CHEST 1 VIEW COMPARISON:  04/10/2012 FINDINGS: The heart size and mediastinal contours are within normal limits. Both lungs are clear. The visualized skeletal structures are unremarkable. IMPRESSION: Normal exam. Electronically Signed   By: Francene Boyers M.D.   On: 03/13/2020 11:36    Procedures Procedures (including critical care time)  Medications Ordered in ED Medications  nitroGLYCERIN (NITROSTAT) SL tablet 0.4 mg (0.4 mg Sublingual Given 03/13/20 1230)  sodium chloride flush (NS) 0.9 % injection 3 mL (has no administration in time range)  aspirin chewable tablet 324 mg (324 mg Oral Given 03/13/20 1138)   ED Course  I have reviewed the triage vital signs and the nursing notes.  Pertinent labs & imaging results that were available during my care of the patient were reviewed by me and considered in my medical decision making (see chart for details).  54 year old male presents for evaluation of chest pain.  Intermittent chest pain worse with exertion over last 4 days.  Patient states he can only minimally exert himself due to worsening left-sided chest pain, shortness of breath.  Did have episode of diaphoresis with chest pain yesterday.  Prior myocardial perfusion scan personally reviewed from 2013  which did not show any significant abnormalities.  No edema to lower legs.  No tachypnea, tachycardia or hypoxia.  A low suspicion for PE.  He has equal radial pulses bilaterally.  Pain is not radiating to his back, no neurologic symptoms.  Low suspicion for dissection.  We will plan on aspirin, nitro for pain, labs, EKG, reevaluation.  Heart score 4, wells criteria low risk. Patient states pain with improvement with nitro however not completely resolved.   Labs and imaging personally reviewed Covid negative Metabolic panel with mild hyperglycemia to 125 Initial troponin III CBC without leukocytosis EKG with no STEMI. Not pulling into Epic however attending has viewed and is resulted under cards tab. DG chest without infiltrates, cardiomegaly, pulmonary edema or pneumothorax  CONSULT with Trish with cardiology.  They will assess patient here in ED if an exertional chest pain with shortness of breath and diaphoresis  Patient evaluated by cardiology.  They plan to admit.  Patient currently chest pain-free.  Will hold on additional nitro  given currently chest pain-free. Defer Heparin to Cardiology rec's. Low suspicion for PE, Dissection.  The patient appears reasonably stabilized for admission considering the current resources, flow, and capabilities available in the ED at this time, and I doubt any other [facility] requiring further screening and/or treatment in the ED prior to admission.   Patient discussed with attending physician, Dr. Judd Lien who agrees with the treatment, plan and disposition.    MDM Rules/Calculators/A&P                      Victor Martin was evaluated in Emergency Department on 03/13/2020 for the symptoms described in the history of present illness. He was evaluated in the context of the global COVID-19 pandemic, which necessitated consideration that the patient might be at risk for infection with the SARS-CoV-2 virus that causes COVID-19. Institutional protocols and  algorithms that pertain to the evaluation of patients at risk for COVID-19  are in a state of rapid change based on information released by regulatory bodies including the CDC and federal and state organizations. These policies and algorithms were followed during the patient's care in the ED. Final Clinical Impression(s) / ED Diagnoses Final diagnoses:  Precordial pain    Rx / DC Orders ED Discharge Orders    None       Rayanne Padmanabhan A, PA-C 03/13/20 1610    Geoffery Lyons, MD 03/14/20 [phone]

## 2020-03-14 ENCOUNTER — Other Ambulatory Visit: Payer: Self-pay | Admitting: *Deleted

## 2020-03-14 ENCOUNTER — Observation Stay (HOSPITAL_BASED_OUTPATIENT_CLINIC_OR_DEPARTMENT_OTHER): Payer: 59

## 2020-03-14 ENCOUNTER — Encounter (HOSPITAL_COMMUNITY)
Admission: EM | Disposition: A | Discharge status: Home / Self Care | Payer: Self-pay | Source: Home / Self Care | Attending: Emergency Medicine

## 2020-03-14 ENCOUNTER — Other Ambulatory Visit: Payer: Self-pay | Admitting: Cardiology

## 2020-03-14 DIAGNOSIS — R072 Precordial pain: Secondary | ICD-10-CM | POA: Diagnosis not present

## 2020-03-14 DIAGNOSIS — E7800 Pure hypercholesterolemia, unspecified: Secondary | ICD-10-CM

## 2020-03-14 DIAGNOSIS — I2510 Atherosclerotic heart disease of native coronary artery without angina pectoris: Secondary | ICD-10-CM

## 2020-03-14 DIAGNOSIS — R001 Bradycardia, unspecified: Secondary | ICD-10-CM | POA: Diagnosis not present

## 2020-03-14 DIAGNOSIS — R0602 Shortness of breath: Secondary | ICD-10-CM

## 2020-03-14 DIAGNOSIS — I208 Other forms of angina pectoris: Secondary | ICD-10-CM | POA: Diagnosis not present

## 2020-03-14 DIAGNOSIS — J449 Chronic obstructive pulmonary disease, unspecified: Secondary | ICD-10-CM

## 2020-03-14 DIAGNOSIS — Z720 Tobacco use: Secondary | ICD-10-CM

## 2020-03-14 DIAGNOSIS — R079 Chest pain, unspecified: Secondary | ICD-10-CM

## 2020-03-14 HISTORY — PX: LEFT HEART CATH AND CORONARY ANGIOGRAPHY: CATH118249

## 2020-03-14 LAB — CBC
HCT: 43.1 % (ref 39.0–52.0)
Hemoglobin: 14.7 g/dL (ref 13.0–17.0)
MCH: 33 pg (ref 26.0–34.0)
MCHC: 34.1 g/dL (ref 30.0–36.0)
MCV: 96.9 fL (ref 80.0–100.0)
Platelets: 228 10*3/uL (ref 150–400)
RBC: 4.45 MIL/uL (ref 4.22–5.81)
RDW: 12.9 % (ref 11.5–15.5)
WBC: 9.2 10*3/uL (ref 4.0–10.5)
nRBC: 0 % (ref 0.0–0.2)

## 2020-03-14 LAB — ECHOCARDIOGRAM COMPLETE
Height: 70 in
Weight: 2520 oz

## 2020-03-14 LAB — D-DIMER, QUANTITATIVE: D-Dimer, Quant: 0.33 ug/mL-FEU (ref 0.00–0.50)

## 2020-03-14 LAB — HEPARIN LEVEL (UNFRACTIONATED): Heparin Unfractionated: 0.39 IU/mL (ref 0.30–0.70)

## 2020-03-14 LAB — SARS CORONAVIRUS 2 (TAT 6-24 HRS): SARS Coronavirus 2: NEGATIVE

## 2020-03-14 SURGERY — LEFT HEART CATH AND CORONARY ANGIOGRAPHY
Anesthesia: LOCAL

## 2020-03-14 MED ORDER — HEPARIN (PORCINE) IN NACL 1000-0.9 UT/500ML-% IV SOLN
INTRAVENOUS | Status: AC
  Filled 2020-03-14: qty 1000

## 2020-03-14 MED ORDER — FENTANYL CITRATE (PF) 100 MCG/2ML IJ SOLN
INTRAMUSCULAR | Status: DC | PRN
  Administered 2020-03-14: 25 ug via INTRAVENOUS

## 2020-03-14 MED ORDER — VERAPAMIL HCL 2.5 MG/ML IV SOLN
INTRAVENOUS | Status: DC | PRN
  Administered 2020-03-14: 10 mL via INTRA_ARTERIAL

## 2020-03-14 MED ORDER — HEPARIN SODIUM (PORCINE) 1000 UNIT/ML IJ SOLN
INTRAMUSCULAR | Status: DC | PRN
  Administered 2020-03-14: 4000 [IU] via INTRAVENOUS

## 2020-03-14 MED ORDER — SODIUM CHLORIDE 0.9 % WEIGHT BASED INFUSION: 3.0000 mL/kg/h | INTRAVENOUS | Status: DC

## 2020-03-14 MED ORDER — ATORVASTATIN CALCIUM 10 MG PO TABS: 20.0000 mg | ORAL_TABLET | Freq: Every day | ORAL | Status: DC

## 2020-03-14 MED ORDER — MIDAZOLAM HCL 2 MG/2ML IJ SOLN
INTRAMUSCULAR | Status: AC
  Filled 2020-03-14: qty 2

## 2020-03-14 MED ORDER — SODIUM CHLORIDE 0.9% FLUSH: 3.0000 mL | Freq: Two times a day (BID) | INTRAVENOUS | Status: DC

## 2020-03-14 MED ORDER — HEPARIN (PORCINE) IN NACL 1000-0.9 UT/500ML-% IV SOLN
INTRAVENOUS | Status: DC | PRN
  Administered 2020-03-14 (×2): 500 mL

## 2020-03-14 MED ORDER — IOHEXOL 350 MG/ML SOLN
INTRAVENOUS | Status: DC | PRN
  Administered 2020-03-14: 50 mL

## 2020-03-14 MED ORDER — VERAPAMIL HCL 2.5 MG/ML IV SOLN
INTRAVENOUS | Status: AC
  Filled 2020-03-14: qty 2

## 2020-03-14 MED ORDER — ASPIRIN 81 MG PO TBEC: 81.0000 mg | DELAYED_RELEASE_TABLET | Freq: Every day | ORAL | 3 refills | Status: DC

## 2020-03-14 MED ORDER — SODIUM CHLORIDE 0.9 % IV SOLN: 250.0000 mL | INTRAVENOUS | Status: DC | PRN

## 2020-03-14 MED ORDER — MIDAZOLAM HCL 2 MG/2ML IJ SOLN
INTRAMUSCULAR | Status: DC | PRN
  Administered 2020-03-14: 2 mg via INTRAVENOUS

## 2020-03-14 MED ORDER — LIDOCAINE HCL (PF) 1 % IJ SOLN
INTRAMUSCULAR | Status: DC | PRN
  Administered 2020-03-14: 2 mL

## 2020-03-14 MED ORDER — ASPIRIN 81 MG PO CHEW: 81.0000 mg | CHEWABLE_TABLET | ORAL | Status: DC

## 2020-03-14 MED ORDER — SODIUM CHLORIDE 0.9 % WEIGHT BASED INFUSION: 1.0000 mL/kg/h | INTRAVENOUS | Status: DC

## 2020-03-14 MED ORDER — LIDOCAINE HCL (PF) 1 % IJ SOLN
INTRAMUSCULAR | Status: AC
  Filled 2020-03-14: qty 30

## 2020-03-14 MED ORDER — FENTANYL CITRATE (PF) 100 MCG/2ML IJ SOLN
INTRAMUSCULAR | Status: AC
  Filled 2020-03-14: qty 2

## 2020-03-14 MED ORDER — ASPIRIN 81 MG PO CHEW
81.0000 mg | CHEWABLE_TABLET | ORAL | Status: DC
  Filled 2020-03-14: qty 1

## 2020-03-14 MED ORDER — LABETALOL HCL 5 MG/ML IV SOLN: 10.0000 mg | INTRAVENOUS | Status: DC | PRN

## 2020-03-14 MED ORDER — SODIUM CHLORIDE 0.9% FLUSH: 3.0000 mL | INTRAVENOUS | Status: DC | PRN

## 2020-03-14 MED ORDER — HYDRALAZINE HCL 20 MG/ML IJ SOLN: 10.0000 mg | INTRAMUSCULAR | Status: DC | PRN

## 2020-03-14 MED ORDER — SODIUM CHLORIDE 0.9 % WEIGHT BASED INFUSION
1.0000 mL/kg/h | INTRAVENOUS | Status: DC
  Administered 2020-03-14: 250 mL via INTRAVENOUS

## 2020-03-14 MED ORDER — ATORVASTATIN CALCIUM 20 MG PO TABS: 20.0000 mg | ORAL_TABLET | Freq: Every day | ORAL | 3 refills | Status: DC

## 2020-03-14 MED ORDER — SODIUM CHLORIDE 0.9 % IV SOLN: INTRAVENOUS | Status: DC

## 2020-03-14 MED ORDER — HEPARIN SODIUM (PORCINE) 1000 UNIT/ML IJ SOLN
INTRAMUSCULAR | Status: AC
  Filled 2020-03-14: qty 1

## 2020-03-14 SURGICAL SUPPLY — 12 items
CATH 5FR JL3.5 JR4 ANG PIG MP (CATHETERS) ×1 IMPLANT
DEVICE RAD COMP TR BAND LRG (VASCULAR PRODUCTS) ×1 IMPLANT
GLIDESHEATH SLEND SS 6F .021 (SHEATH) ×1 IMPLANT
GUIDEWIRE INQWIRE 1.5J.035X260 (WIRE) IMPLANT
INQWIRE 1.5J .035X260CM (WIRE) ×2
KIT HEART LEFT (KITS) ×2 IMPLANT
PACK CARDIAC CATHETERIZATION (CUSTOM PROCEDURE TRAY) ×2 IMPLANT
SHEATH PROBE COVER 6X72 (BAG) ×1 IMPLANT
TRANSDUCER W/STOPCOCK (MISCELLANEOUS) ×2 IMPLANT
TUBING ART PRESS 72  MALE/FEM (TUBING) ×2
TUBING ART PRESS 72 MALE/FEM (TUBING) IMPLANT
TUBING CIL FLEX 10 FLL-RA (TUBING) ×2 IMPLANT

## 2020-03-14 NOTE — Progress Notes (Signed)
Cardiac cath reviewed and showed mild non obstructive CAD.  Check stat ddimer.  If normal then ok to discharge home after bedrest from cath done.  Will add low dose statin.  SOB and CP may be related to COPD.  Will refer to Pulmonary for evaluation.

## 2020-03-14 NOTE — Progress Notes (Signed)
  Echocardiogram 2D Echocardiogram has been performed.  Victor Martin Zavior Thomason 03/14/2020, 8:57 AM

## 2020-03-14 NOTE — Interval H&P Note (Signed)
History and Physical Interval Note:  03/14/2020 9:09 AM  Victor Martin  has presented today for surgery, with the diagnosis of chest pain.  The various methods of treatment have been discussed with the patient and family. After consideration of risks, benefits and other options for treatment, the patient has consented to  Procedure(s): LEFT HEART CATH AND CORONARY ANGIOGRAPHY (N/A) as a surgical intervention.  The patient's history has been reviewed, patient examined, no change in status, stable for surgery.  I have reviewed the patient's chart and labs.  Questions were answered to the patient's satisfaction.    Cath Lab Visit (complete for each Cath Lab visit)  Clinical Evaluation Leading to the Procedure:   ACS: No.  Non-ACS:    Anginal Classification: CCS III  Anti-ischemic medical therapy: No Therapy  Non-Invasive Test Results: No non-invasive testing performed  Prior CABG: No previous CABG        Verne Carrow

## 2020-03-14 NOTE — Progress Notes (Signed)
ANTICOAGULATION CONSULT NOTE   Pharmacy Consult for Heparin Indication: chest pain/ACS  Allergies  Allergen Reactions  . Nicotine Itching    Nicotine patch    Patient Measurements: Height: 5\' 10"  (177.8 cm) Weight: 156 lb 8.4 oz (71 kg) IBW/kg (Calculated) : 73 Heparin Dosing Weight: 74.8 kg  Vital Signs: Temp: 97.5 F (36.4 C) (03/18 2338) Temp Source: Oral (03/18 2338) BP: 101/63 (03/18 2338) Pulse Rate: 59 (03/18 2338)  Labs: Recent Labs    03/13/20 1137 03/13/20 1405 03/13/20 1652 03/13/20 2311  HGB 14.8  --  14.2  --   HCT 44.1  --  42.1  --   PLT 235  --  219  --   HEPARINUNFRC  --   --   --  0.34  CREATININE 1.01  --  0.85  --   TROPONINIHS 3 <2  --   --     Estimated Creatinine Clearance: 98.6 mL/min (by C-G formula based on SCr of 0.85 mg/dL).   Assessment: 54 yr old man admitted with chest pain; troponin found to be 3 with repeat <2; ECG is stable and without acute changes, but symptoms are worrisome for CV etiology. LHC planned for further CV evaluation. Pharmacy is consulted to dose heparin. Pt was on no anticoagulants prior to admission.  Heparin level therapeutic (0.34) on gtt at 900 units/hr. No bleeding noted.  Goal of Therapy:  Heparin level 0.3-0.7 units/ml Monitor platelets by anticoagulation protocol: Yes   Plan:  Continue heparin at 900 units/hr. Will f/u confirmatory heparin level in morning  51, PharmD, BCPS Please see amion for complete clinical pharmacist phone list 03/14/2020,12:24 AM

## 2020-03-14 NOTE — H&P (View-Only) (Signed)
Progress Note  Patient Name: Victor Martin Date of Encounter: 03/14/2020  Primary Cardiologist: No primary care provider on file.   Subjective   Denies any further CP or SOB  Inpatient Medications    Scheduled Meds: . aspirin  324 mg Oral NOW   Or  . aspirin  300 mg Rectal NOW  . aspirin  81 mg Oral Pre-Cath  . aspirin EC  81 mg Oral Daily  . sodium chloride flush  3 mL Intravenous Q12H   Continuous Infusions: . sodium chloride    . sodium chloride    . heparin 900 Units/hr (03/13/20 1741)   PRN Meds: sodium chloride, acetaminophen, nitroGLYCERIN, nitroGLYCERIN, ondansetron (ZOFRAN) IV, sodium chloride flush   Vital Signs    Vitals:   03/13/20 1714 03/13/20 1953 03/13/20 2338 03/14/20 0503  BP: 124/78 106/65 101/63 96/60  Pulse: 63 70 (!) 59 64  Resp: 18 20 20 20   Temp: 98.1 F (36.7 C) 98.3 F (36.8 C) (!) 97.5 F (36.4 C) 97.8 F (36.6 C)  TempSrc: Oral Oral Oral Oral  SpO2: 99% 98% 96% 97%  Weight: 71 kg   71.4 kg  Height: 5\' 10"  (1.778 m)       Intake/Output Summary (Last 24 hours) at 03/14/2020 0834 Last data filed at 03/14/2020 0138 Gross per 24 hour  Intake 425.87 ml  Output 600 ml  Net -174.13 ml   Filed Weights   03/13/20 1105 03/13/20 1714 03/14/20 0503  Weight: 74.8 kg 71 kg 71.4 kg    Telemetry    NSR - Personally Reviewed  ECG    No new EKG to review - Personally Reviewed  Physical Exam   GEN: No acute distress.   Neck: No JVD Cardiac: RRR, no murmurs, rubs, or gallops.  Respiratory: Clear to auscultation bilaterally. GI: Soft, nontender, non-distended  MS: No edema; No deformity. Neuro:  Nonfocal  Psych: Normal affect   Labs    Chemistry Recent Labs  Lab 03/13/20 1137 03/13/20 1652  NA 138  --   K 3.6  --   CL 105  --   CO2 22  --   GLUCOSE 125*  --   BUN 11  --   CREATININE 1.01 0.85  CALCIUM 9.1  --   GFRNONAA >60 >60  GFRAA >60 >60  ANIONGAP 11  --      Hematology Recent Labs  Lab 03/13/20 1137  03/13/20 1652 03/14/20 0337  WBC 8.5 9.0 9.2  RBC 4.44 4.27 4.45  HGB 14.8 14.2 14.7  HCT 44.1 42.1 43.1  MCV 99.3 98.6 96.9  MCH 33.3 33.3 33.0  MCHC 33.6 33.7 34.1  RDW 13.0 13.0 12.9  PLT 235 219 228    Cardiac EnzymesNo results for input(s): TROPONINI in the last 168 hours. No results for input(s): TROPIPOC in the last 168 hours.   BNPNo results for input(s): BNP, PROBNP in the last 168 hours.   DDimer No results for input(s): DDIMER in the last 168 hours.   Radiology    DG Chest Portable 1 View  Result Date: 03/13/2020 CLINICAL DATA:  Chest pain. EXAM: PORTABLE CHEST 1 VIEW COMPARISON:  04/10/2012 FINDINGS: The heart size and mediastinal contours are within normal limits. Both lungs are clear. The visualized skeletal structures are unremarkable. IMPRESSION: Normal exam. Electronically Signed   By: 03/15/2020 M.D.   On: 03/13/2020 11:36    Cardiac Studies   None  Patient Profile     54 y.o. male with  no known past medical hx who presented to the ED 03/13/20 after a 4 day hx of chest pain.   Assessment & Plan    1. Chest pain: -Presented with typical anginal symptoms which began Monday. 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 He was in his usual state of health prior to Monday when he began having chest pain with walking into his job site. He reports the distance is about 149ft and would normally be very easy for him>>pain would intensify with exertion and lessen with rest. Symptoms have been fairly constant since Monday. Denies associated diaphoresis, SOB, LE edema, orthopnea, palpitations, dizziness or syncope.  -Has family hx of CAD in his father and mother who both have undergone CABG in their 15's.  -Also reports 1ppd hx of smoking for the last 30-40 years. -HS troponin found to be 3 with a repeat of <2, not consistent with ACS.  -EKG is stable without acute changes however symptoms are worrisome for CV  etiology.  -CXR without cardiopulmonary disease  -Given CRF's including tobacco use and family hx of CAD along with typical anginal symptoms, will plan for an LHC for further CV evaluation  -Creatinine stable at 1.01 -HbA1C normal at 5.4% -Lipids reviewed and LDL 102 and HDL low at 27 -continue ASA and IV Heparin gtt -start atorvastatin 40mg  daily -no BB due to bradycardia and soft BP -plan for LHC today  2. Tobacco Use: -Ongoing 1PPD use for 30-40 years -Has never tried quitting however reports he is willing -continue nicotine patch  3.  HLD -HDL very low -LDL goal if he is found to have CAD is < 70 -start atorvastatin 40mg  daily -will need FLP and ALT in 6 weeks   For questions or updates, please contact [place] Please consult [email or web address] for contact info under Cardiology/STEMI.      Signed, Fransico Him, MD  03/14/2020, 8:34 AM

## 2020-03-14 NOTE — Discharge Instructions (Signed)
Radial Site Care  This sheet gives you information about how to care for yourself after your procedure. Your health care provider may also give you more specific instructions. If you have problems or questions, contact your health care provider. What can I expect after the procedure? After the procedure, it is common to have:  Bruising and tenderness at the catheter insertion area. Follow these instructions at home: Medicines  Take over-the-counter and prescription medicines only as told by your health care provider. Insertion site care  Follow instructions from your health care provider about how to take care of your insertion site. Make sure you: ? Wash your hands with soap and water before you change your bandage (dressing). If soap and water are not available, use hand sanitizer. ? Change your dressing as told by your health care provider. ? Leave stitches (sutures), skin glue, or adhesive strips in place. These skin closures may need to stay in place for 2 weeks or longer. If adhesive strip edges start to loosen and curl up, you may trim the loose edges. Do not remove adhesive strips completely unless your health care provider tells you to do that.  Check your insertion site every day for signs of infection. Check for: ? Redness, swelling, or pain. ? Fluid or blood. ? Pus or a bad smell. ? Warmth.  Do not take baths, swim, or use a hot tub until your health care provider approves.  You may shower 24-48 hours after the procedure, or as directed by your health care provider. ? Remove the dressing and gently wash the site with plain soap and water. ? Pat the area dry with a clean towel. ? Do not rub the site. That could cause bleeding.  Do not apply powder or lotion to the site. Activity   For 24 hours after the procedure, or as directed by your health care provider: ? Do not flex or bend the affected arm. ? Do not push or pull heavy objects with the affected arm. ? Do not  drive yourself home from the hospital or clinic. You may drive 24 hours after the procedure unless your health care provider tells you not to. ? Do not operate machinery or power tools.  Do not lift anything that is heavier than 10 lb (4.5 kg), or the limit that you are told, until your health care provider says that it is safe.  Ask your health care provider when it is okay to: ? Return to work or school. ? Resume usual physical activities or sports. ? Resume sexual activity. General instructions  If the catheter site starts to bleed, raise your arm and put firm pressure on the site. If the bleeding does not stop, get help right away. This is a medical emergency.  If you went home on the same day as your procedure, a responsible adult should be with you for the first 24 hours after you arrive home.  Keep all follow-up visits as told by your health care provider. This is important. Contact a health care provider if:  You have a fever.  You have redness, swelling, or yellow drainage around your insertion site. Get help right away if:  You have unusual pain at the radial site.  The catheter insertion area swells very fast.  The insertion area is bleeding, and the bleeding does not stop when you hold steady pressure on the area.  Your arm or hand becomes pale, cool, tingly, or numb. These symptoms may represent a serious problem   that is an emergency. Do not wait to see if the symptoms will go away. Get medical help right away. Call your local emergency services (911 in the [place] Do not drive yourself to the hospital. Summary  After the procedure, it is common to have bruising and tenderness at the site.  Follow instructions from your health care provider about how to take care of your radial site wound. Check the wound every day for signs of infection.  Do not lift anything that is heavier than 10 lb (4.5 kg), or the limit that you are told, until your health care provider says  that it is safe. This information is not intended to replace advice given to you by your health care provider. Make sure you discuss any questions you have with your health care provider. Document Revised: 01/18/2018 Document Reviewed: 01/18/2018 Elsevier Patient Education  2020 [employer]  

## 2020-03-14 NOTE — Discharge Summary (Signed)
Discharge Summary    Patient ID: [name] MRN: [id number]; DOB: 1966-07-21  Admit date: 03/13/2020 Discharge date: 03/14/2020  Primary Care Provider: Lucky Cowboy, MD  Primary Cardiologist: Armanda Magic, MD  Primary Electrophysiologist:  None   Discharge Diagnoses    Active Problems:   Chest pain    Diagnostic Studies/Procedures    LEFT HEART CATH AND CORONARY ANGIOGRAPHY 03/14/20  Conclusion   Prox RCA lesion is 30% stenosed.  Mid RCA lesion is 30% stenosed.  Prox Cx to Mid Cx lesion is 20% stenosed.  Mid LAD lesion is 20% stenosed.   1. Mild non-obstructive CAD 2. Normal LVEDP  Recommendations: Medical management of non-obstructive CAD.    _____________  Echocardiogram 03/14/20 IMPRESSIONS   1. Left ventricular ejection fraction, by estimation, is 60 to 65%. The  left ventricle has normal function. The left ventricle has grossly normal  regional wall motion abnormalities. Left ventricular diastolic parameters  were normal.  2. Right ventricular systolic function is normal. The right ventricular  size is normal. Tricuspid regurgitation signal is inadequate for assessing  PA pressure.  3. The mitral valve is normal in structure. Trivial mitral valve  regurgitation. No evidence of mitral stenosis.  4. The aortic valve is tricuspid. Aortic valve regurgitation is not  visualized. No aortic stenosis is present.  5. The inferior vena cava is normal in size with greater than 50%  respiratory variability, suggesting right atrial pressure of 3 mmHg.    History of Present Illness     Victor Martin is a 54 y.o. male with with no prior cardiac hx but a fm hx of CAD in both his mom and dad as well as 40 pack year hx of tobacco abuse which is ongoing.  He has been in his usual state of health and walks his dog daily without any problems as well as no problems walking at his job.  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  As soon as he rests the pain resolves.  Over the past few days he gets CP as soon as he starts to walk his dog.  Due to progressive exertional CP he presented to the ER today for evaluation. In ER high-sensitivity troponins were 3 and <2.  EKG is normal.    Hospital Course     Consultants: None  With symptoms concerning for unstable angina and significant CVD risk factors, the patient was taken for cardiac catheterization this morning that revealed mild nonobstructive CAD.  Echocardiogram done today showed normal LV systolic function with EF 60-65% and grossly normal  regional wall motion.   A D-dimer was not elevated.  Lipid panel was checked with LDL of 102. He was started on atorvastatin 20 mg daily.  He will need a fasting lipid panel and ALT in 6 weeks, orders placed for 4/30.   Shortness of breath and chest pain may be related to COPD.  We are placing an outpatient referral to pulmonology for further evaluation. Pt advised on smoking cessation.   Patient has been seen by Dr. Mayford Knife today and deemed ready for discharge home. All follow up appointments have been scheduled. Discharge medications are listed below.   Did the patient have an acute coronary syndrome (MI, NSTEMI, STEMI, etc) this admission?:  No  Did the patient have a percutaneous coronary intervention (stent / angioplasty)?:  No.   _____________  Discharge Vitals Blood pressure 111/71, pulse (!) 57, temperature (!) 97.5 F (36.4 C), temperature source Oral, resp. rate 18, height 5\' 10"  (1.778 m), weight 71.4 kg, SpO2 98 %.  Filed Weights   03/13/20 1105 03/13/20 1714 03/14/20 0503  Weight: 74.8 kg 71 kg 71.4 kg    Labs & Radiologic Studies    CBC Recent Labs    03/13/20 1137 03/13/20 1137 03/13/20 1652 03/14/20 0337  WBC 8.5   < > 9.0 9.2  NEUTROABS 4.7  --   --   --   HGB 14.8   < > 14.2 14.7  HCT 44.1   < > 42.1 43.1    MCV 99.3   < > 98.6 96.9  PLT 235   < > 219 228   < > = values in this interval not displayed.   Basic Metabolic Panel Recent Labs    96/03/5402/18/21 1137 03/13/20 1652  NA 138  --   K 3.6  --   CL 105  --   CO2 22  --   GLUCOSE 125*  --   BUN 11  --   CREATININE 1.01 0.85  CALCIUM 9.1  --    Liver Function Tests No results for input(s): AST, ALT, ALKPHOS, BILITOT, PROT, ALBUMIN in the last 72 hours. No results for input(s): LIPASE, AMYLASE in the last 72 hours. High Sensitivity Troponin:   Recent Labs  Lab 03/13/20 1137 03/13/20 1405  TROPONINIHS 3 <2    BNP Invalid input(s): POCBNP D-Dimer Recent Labs    03/14/20 1042  DDIMER 0.33   Hemoglobin A1C Recent Labs    03/13/20 1644  HGBA1C 5.4   Fasting Lipid Panel Recent Labs    03/13/20 1644  CHOL 167  HDL 27*  LDLCALC 102*  TRIG 189*  CHOLHDL 6.2   Thyroid Function Tests No results for input(s): TSH, T4TOTAL, T3FREE, THYROIDAB in the last 72 hours.  Invalid input(s): FREET3 _____________  CARDIAC CATHETERIZATION  Result Date: 03/14/2020  Prox RCA lesion is 30% stenosed.  Mid RCA lesion is 30% stenosed.  Prox Cx to Mid Cx lesion is 20% stenosed.  Mid LAD lesion is 20% stenosed.  1. Mild non-obstructive CAD 2. Normal LVEDP Recommendations: Medical management of non-obstructive CAD.   DG Chest Portable 1 View  Result Date: 03/13/2020 CLINICAL DATA:  Chest pain. EXAM: PORTABLE CHEST 1 VIEW COMPARISON:  04/10/2012 FINDINGS: The heart size and mediastinal contours are within normal limits. Both lungs are clear. The visualized skeletal structures are unremarkable. IMPRESSION: Normal exam. Electronically Signed   By: Francene BoyersJames  Maxwell M.D.   On: 03/13/2020 11:36   ECHOCARDIOGRAM COMPLETE  Result Date: 03/14/2020    ECHOCARDIOGRAM REPORT   Patient Name:   Victor HeidelbergDONALD R Thrush Date of Exam: 03/14/2020 Medical Rec #:  [id number]         Height:       70.0 in Accession #:    [id number]        Weight:       157.5 lb Date  of Birth:  1966-05-22         BSA:          1.886 m Patient Age:    54 years          BP:           96/60 mmHg Patient Gender: M  HR:           59 bpm. Exam Location:  Inpatient Procedure: 2D Echo, Cardiac Doppler and Color Doppler                             MODIFIED REPORT: This report was modified by Cherlynn Kaiser MD on 03/14/2020 due to typo.  Indications:     R07.9* Chest pain, unspecified  History:         Patient has no prior history of Echocardiogram examinations.                  Risk Factors:Dyslipidemia and GERD.  Sonographer:     Jonelle Sidle Dance Referring Phys:  [place] Phys: Cherlynn Kaiser MD IMPRESSIONS  1. Left ventricular ejection fraction, by estimation, is 60 to 65%. The left ventricle has normal function. The left ventricle has grossly normal regional wall motion. Left ventricular diastolic parameters were normal.  2. Right ventricular systolic function is normal. The right ventricular size is normal. Tricuspid regurgitation signal is inadequate for assessing PA pressure.  3. The mitral valve is normal in structure. Trivial mitral valve regurgitation. No evidence of mitral stenosis.  4. The aortic valve is tricuspid. Aortic valve regurgitation is not visualized. No aortic stenosis is present.  5. The inferior vena cava is normal in size with greater than 50% respiratory variability, suggesting right atrial pressure of 3 mmHg. Comparison(s): No prior Echocardiogram. Conclusion(s)/Recommendation(s): Normal biventricular function without evidence of hemodynamically significant valvular heart disease. FINDINGS  Left Ventricle: Left ventricular ejection fraction, by estimation, is 60 to 65%. The left ventricle has normal function. The left ventricle has grossly normal regional wall motion, though endocardial border is not optimally defined in all views to exclude subtle regional wall motion abnormalities. The left ventricular internal cavity size was normal in  size. There is no left ventricular hypertrophy. Left ventricular diastolic parameters were normal. Right Ventricle: The right ventricular size is normal. No increase in right ventricular wall thickness. Right ventricular systolic function is normal. Tricuspid regurgitation signal is inadequate for assessing PA pressure. Left Atrium: Left atrial size was normal in size. Right Atrium: Right atrial size was normal in size. Pericardium: There is no evidence of pericardial effusion. Mitral Valve: The mitral valve is normal in structure. Normal mobility of the mitral valve leaflets. Trivial mitral valve regurgitation. No evidence of mitral valve stenosis. Tricuspid Valve: The tricuspid valve is normal in structure. Tricuspid valve regurgitation is trivial. No evidence of tricuspid stenosis. Aortic Valve: The aortic valve is tricuspid. Aortic valve regurgitation is not visualized. No aortic stenosis is present. Pulmonic Valve: The pulmonic valve was normal in structure. Pulmonic valve regurgitation is trivial. No evidence of pulmonic stenosis. Aorta: The aortic root and ascending aorta are structurally normal, with no evidence of dilitation. Venous: The inferior vena cava is normal in size with greater than 50% respiratory variability, suggesting right atrial pressure of 3 mmHg. IAS/Shunts: No atrial level shunt detected by color flow Doppler.  LEFT VENTRICLE PLAX 2D LVIDd:         4.10 cm  Diastology LVIDs:         2.70 cm  LV e' lateral:   10.10 cm/s LV PW:         1.00 cm  LV E/e' lateral: 7.5 LV IVS:        1.00 cm  LV e' medial:    9.90 cm/s LVOT diam:  2.00 cm  LV E/e' medial:  7.6 LV SV:         73 LV SV Index:   39 LVOT Area:     3.14 cm  RIGHT VENTRICLE             IVC RV Basal diam:  2.40 cm     IVC diam: 1.00 cm RV S prime:     10.40 cm/s TAPSE (M-mode): 2.6 cm LEFT ATRIUM             Index LA diam:        3.20 cm 1.70 cm/m LA Vol (A2C):   61.2 ml 32.45 ml/m LA Vol (A4C):   33.3 ml 17.66 ml/m LA Biplane  Vol: 45.6 ml 24.18 ml/m  AORTIC VALVE LVOT Vmax:   88.70 cm/s LVOT Vmean:  61.700 cm/s LVOT VTI:    0.233 m  AORTA Ao Root diam: 3.30 cm Ao Asc diam:  3.30 cm MITRAL VALVE MV Area (PHT): 2.95 cm    SHUNTS MV Decel Time: 257 msec    Systemic VTI:  0.23 m MV E velocity: 75.70 cm/s  Systemic Diam: 2.00 cm MV A velocity: 53.80 cm/s MV E/A ratio:  1.41 Weston Brass MD Electronically signed by Weston Brass MD Signature Date/Time: 03/14/2020/11:09:29 AM    Final (Updated)    Disposition   Pt is being discharged home today in good condition.  Follow-up Plans & Appointments    Follow-up Information    [facility] Office Follow up.   Specialty: Cardiology Why: Please go to our [facility] office on 04/26/19 in the morning for fasting cholesterol labs. Opens at 7:30. If you cannot make this day please call to reschedule.  Contact information: [address] [place] [address] [phone]       Quintella Reichert, MD Follow up.   Specialty: Cardiology Why: Cardiology hospital follow up on 04/15/20 at 9:00. Please arrive 15 minutes early for check in.  Contact information: [address] [place] [address] [phone]        [facility] Follow up.   Why: You will be called to arrange a referral to pulmonology to further evaluate your shortness of breath.          Discharge Instructions    Diet - low sodium heart healthy   Complete by: As directed    Discharge instructions   Complete by: As directed    PLEASE REMEMBER TO BRING ALL OF YOUR MEDICATIONS TO EACH OF YOUR FOLLOW-UP OFFICE VISITS.  PLEASE ATTEND ALL SCHEDULED FOLLOW-UP APPOINTMENTS.   Activity: Increase activity slowly as tolerated. You may shower, but no soaking baths (or swimming) for 1 week. No driving for 24 hours. No lifting over 5 lbs for 1 week. No sexual activity for 1 week.   Wound Care: You may wash cath site gently with soap and water. Keep  cath site clean and dry. If you notice pain, swelling, bleeding or pus at your cath site, please call [phone].   Increase activity slowly   Complete by: As directed       Discharge Medications   Allergies as of 03/14/2020      Reactions   Nicotine Itching   Nicotine patch      Medication List    STOP taking these medications   meloxicam 15 MG tablet Commonly known as: MOBIC     TAKE these medications   aspirin 81 MG EC tablet Take  1 tablet (81 mg total) by mouth daily. Start taking on: March 15, 2020   atorvastatin 20 MG tablet Commonly known as: LIPITOR Take 1 tablet (20 mg total) by mouth daily at 6 PM.   Vitamin D3 1.25 MG (50000 UT) Caps Take 1 capsule 3 x week on Mon wed & Fri          Outstanding Labs/Studies   FLP/ALT in 6 weeks. Order placed.   Duration of Discharge Encounter   Greater than 30 minutes including physician time.  Signed, Berton Bon, NP 03/14/2020, 1:17 PM

## 2020-03-14 NOTE — Progress Notes (Addendum)
Progress Note  Patient Name: Victor Martin Date of Encounter: 03/14/2020  Primary Cardiologist: No primary care provider on file.   Subjective   Denies any further CP or SOB  Inpatient Medications    Scheduled Meds: . aspirin  324 mg Oral NOW   Or  . aspirin  300 mg Rectal NOW  . aspirin  81 mg Oral Pre-Cath  . aspirin EC  81 mg Oral Daily  . sodium chloride flush  3 mL Intravenous Q12H   Continuous Infusions: . sodium chloride    . sodium chloride    . heparin 900 Units/hr (03/13/20 1741)   PRN Meds: sodium chloride, acetaminophen, nitroGLYCERIN, nitroGLYCERIN, ondansetron (ZOFRAN) IV, sodium chloride flush   Vital Signs    Vitals:   03/13/20 1714 03/13/20 1953 03/13/20 2338 03/14/20 0503  BP: 124/78 106/65 101/63 96/60  Pulse: 63 70 (!) 59 64  Resp: 18 20 20 20   Temp: 98.1 F (36.7 C) 98.3 F (36.8 C) (!) 97.5 F (36.4 C) 97.8 F (36.6 C)  TempSrc: Oral Oral Oral Oral  SpO2: 99% 98% 96% 97%  Weight: 71 kg   71.4 kg  Height: 5\' 10"  (1.778 m)       Intake/Output Summary (Last 24 hours) at 03/14/2020 0834 Last data filed at 03/14/2020 0138 Gross per 24 hour  Intake 425.87 ml  Output 600 ml  Net -174.13 ml   Filed Weights   03/13/20 1105 03/13/20 1714 03/14/20 0503  Weight: 74.8 kg 71 kg 71.4 kg    Telemetry    NSR - Personally Reviewed  ECG    No new EKG to review - Personally Reviewed  Physical Exam   GEN: No acute distress.   Neck: No JVD Cardiac: RRR, no murmurs, rubs, or gallops.  Respiratory: Clear to auscultation bilaterally. GI: Soft, nontender, non-distended  MS: No edema; No deformity. Neuro:  Nonfocal  Psych: Normal affect   Labs    Chemistry Recent Labs  Lab 03/13/20 1137 03/13/20 1652  NA 138  --   K 3.6  --   CL 105  --   CO2 22  --   GLUCOSE 125*  --   BUN 11  --   CREATININE 1.01 0.85  CALCIUM 9.1  --   GFRNONAA >60 >60  GFRAA >60 >60  ANIONGAP 11  --      Hematology Recent Labs  Lab 03/13/20 1137  03/13/20 1652 03/14/20 0337  WBC 8.5 9.0 9.2  RBC 4.44 4.27 4.45  HGB 14.8 14.2 14.7  HCT 44.1 42.1 43.1  MCV 99.3 98.6 96.9  MCH 33.3 33.3 33.0  MCHC 33.6 33.7 34.1  RDW 13.0 13.0 12.9  PLT 235 219 228    Cardiac EnzymesNo results for input(s): TROPONINI in the last 168 hours. No results for input(s): TROPIPOC in the last 168 hours.   BNPNo results for input(s): BNP, PROBNP in the last 168 hours.   DDimer No results for input(s): DDIMER in the last 168 hours.   Radiology    DG Chest Portable 1 View  Result Date: 03/13/2020 CLINICAL DATA:  Chest pain. EXAM: PORTABLE CHEST 1 VIEW COMPARISON:  04/10/2012 FINDINGS: The heart size and mediastinal contours are within normal limits. Both lungs are clear. The visualized skeletal structures are unremarkable. IMPRESSION: Normal exam. Electronically Signed   By: 03/15/2020 M.D.   On: 03/13/2020 11:36    Cardiac Studies   None  Patient Profile     54 y.o. male with  no known past medical hx who presented to the ED 03/13/20 after a 4 day hx of chest pain.   Assessment & Plan    1. Chest pain: -Presented with typical anginal symptoms which began Monday. 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 He was in his usual state of health prior to Monday when he began having chest pain with walking into his job site. He reports the distance is about 161ft and would normally be very easy for him>>pain would intensify with exertion and lessen with rest. Symptoms have been fairly constant since Monday. Denies associated diaphoresis, SOB, LE edema, orthopnea, palpitations, dizziness or syncope.  -Has family hx of CAD in his father and mother who both have undergone CABG in their 23's.  -Also reports 1ppd hx of smoking for the last 30-40 years. -HS troponin found to be 3 with a repeat of <2, not consistent with ACS.  -EKG is stable without acute changes however symptoms are worrisome for CV  etiology.  -CXR without cardiopulmonary disease  -Given CRF's including tobacco use and family hx of CAD along with typical anginal symptoms, will plan for an LHC for further CV evaluation  -Creatinine stable at 1.01 -HbA1C normal at 5.4% -Lipids reviewed and LDL 102 and HDL low at 27 -continue ASA and IV Heparin gtt -start atorvastatin 40mg  daily -no BB due to bradycardia and soft BP -plan for LHC today  2. Tobacco Use: -Ongoing 1PPD use for 30-40 years -Has never tried quitting however reports he is willing -continue nicotine patch  3.  HLD -HDL very low -LDL goal if he is found to have CAD is < 70 -start atorvastatin 20mg  daily -will need FLP and ALT in 6 weeks   For questions or updates, please contact [facility] Please consult [email or web address] for contact info under Cardiology/STEMI.      Signed, , MD  03/14/2020, 8:34 AM

## 2020-03-14 NOTE — Progress Notes (Signed)
Lizabeth Leyden, NP who is over a the hospital today asked if I could help with placing a referral to Pulmonology. I have placed order for  Pulmonology referred to Dr. Delton Coombes or to which Pulmonologist would be able to see the pt first. Per Lizabeth Leyden, NP order to be placed under Dr. Armanda Magic.

## 2020-03-14 NOTE — Progress Notes (Signed)
ANTICOAGULATION CONSULT NOTE   Pharmacy Consult for Heparin Indication: chest pain/ACS  Allergies  Allergen Reactions  . Nicotine Itching    Nicotine patch    Patient Measurements: Height: 5\' 10"  (177.8 cm) Weight: 157 lb 8 oz (71.4 kg) IBW/kg (Calculated) : 73 Heparin Dosing Weight: 74.8 kg  Vital Signs: Temp: 97.8 F (36.6 C) (03/19 0503) Temp Source: Oral (03/19 0503) BP: 96/60 (03/19 0503) Pulse Rate: 64 (03/19 0503)  Labs: Recent Labs    03/13/20 1137 03/13/20 1137 03/13/20 1405 03/13/20 1652 03/13/20 2311 03/14/20 0337  HGB 14.8   < >  --  14.2  --  14.7  HCT 44.1  --   --  42.1  --  43.1  PLT 235  --   --  219  --  228  HEPARINUNFRC  --   --   --   --  0.34 0.39  CREATININE 1.01  --   --  0.85  --   --   TROPONINIHS 3  --  <2  --   --   --    < > = values in this interval not displayed.    Estimated Creatinine Clearance: 99.2 mL/min (by C-G formula based on SCr of 0.85 mg/dL).   Assessment: 54 yr old man admitted with chest pain; troponin found to be 3 with repeat <2; ECG is stable and without acute changes, but symptoms are worrisome for CV etiology. LHC planned for further CV evaluation. Pharmacy is consulted to dose heparin. Pt was on no anticoagulants prior to admission.  Heparin level remains therapeutic x 2, for LHC this afternoon.  Goal of Therapy:  Heparin level 0.3-0.7 units/ml Monitor platelets by anticoagulation protocol: Yes   Plan:  Continue heparin gtt at 900 units/hr F/u post-cath  51, PharmD Clinical Pharmacist Please check AMION for all [facility] numbers 03/14/2020 8:46 AM

## 2020-04-08 ENCOUNTER — Institutional Professional Consult (permissible substitution): Payer: 59 | Admitting: Internal Medicine

## 2020-04-15 ENCOUNTER — Ambulatory Visit: Payer: 59 | Admitting: Cardiology

## 2020-04-15 NOTE — Progress Notes (Deleted)
Cardiology Office Note:    Date:  04/15/2020   ID:  Victor Martin, DOB 02/05/1966, MRN [id number]  PCP:  Lucky Cowboy, MD  Cardiologist:  Armanda Magic, MD    Referring MD: Lucky Cowboy, MD   No chief complaint on file.   History of Present Illness:    Victor Martin is a 54 y.o. male with a hx of tobacco abuse, GERD, HLD and recent hospitalization for chest pain.  His hstrop were normal in ER and EKG was nonischemic. He underwent cath showing nonobstrucitve CAD with 30% prox and mid RCA, 20% prox LCx and 20% mid LAD and normal LVEDP.  He was started on medical management and is back here today for folowup.    He is here today for followup and is doing well.  He denies any chest pain or pressure, SOB, DOE, PND, orthopnea, LE edema, dizziness, palpitations or syncope. He is compliant with his meds and is tolerating meds with no SE.    Past Medical History:  Diagnosis Date  . Anxiety   . Anxiety state, unspecified 06/26/2014  . Arthritis   . GERD (gastroesophageal reflux disease)   . Hyperlipemia     Past Surgical History:  Procedure Laterality Date  . LEFT HEART CATH AND CORONARY ANGIOGRAPHY N/A 03/14/2020   Procedure: LEFT HEART CATH AND CORONARY ANGIOGRAPHY;  Surgeon: Kathleene Hazel, MD;  Location: [facility];  Service: Cardiovascular;  Laterality: N/A;  . LEG SURGERY     steel rod in left leg    Current Medications: No outpatient medications have been marked as taking for the 04/15/20 encounter (Appointment) with Quintella Reichert, MD.     Allergies:   Nicotine   Social History   Socioeconomic History  . Marital status: Single    Spouse name: Not on file  . Number of children: Not on file  . Years of education: Not on file  . Highest education level: Not on file  Occupational History  . Not on file  Tobacco Use  . Smoking status: Current Every Day Smoker    Packs/day: 2.00    Types: Cigarettes  . Smokeless tobacco: Never Used    Substance and Sexual Activity  . Alcohol use: Yes    Comment: rarely  . Drug use: No  . Sexual activity: Not on file  Other Topics Concern  . Not on file  Social History Narrative  . Not on file   Social Determinants of Health   Financial Resource Strain:   . Difficulty of Paying Living Expenses:   Food Insecurity:   . Worried About Programme researcher, broadcasting/film/video in the Last Year:   . Barista in the Last Year:   Transportation Needs:   . Freight forwarder (Medical):   Marland Kitchen Lack of Transportation (Non-Medical):   Physical Activity:   . Days of Exercise per Week:   . Minutes of Exercise per Session:   Stress:   . Feeling of Stress :   Social Connections:   . Frequency of Communication with Friends and Family:   . Frequency of Social Gatherings with Friends and Family:   . Attends Religious Services:   . Active Member of Clubs or Organizations:   . Attends Banker Meetings:   Marland Kitchen Marital Status:      Family History: The patient's family history includes Colon polyps in an other family member; Diabetes in an other family member; Heart disease in an other family  member; Hyperlipidemia in an other family member; Hypertension in an other family member; Thyroid disease in an other family member.  ROS:   Please see the history of present illness.    ROS  All other systems reviewed and negative.   EKGs/Labs/Other Studies Reviewed:    The following studies were reviewed today: Cardiac cath  EKG:  EKG is not ordered today.   Recent Labs: 03/13/2020: BUN 11; Creatinine, Ser 0.85; Potassium 3.6; Sodium 138 03/14/2020: Hemoglobin 14.7; Platelets 228   Recent Lipid Panel    Component Value Date/Time   CHOL 167 03/13/2020 1644   CHOL 144 12/11/2012 0406   TRIG 189 (H) 03/13/2020 1644   TRIG 191 12/11/2012 0406   HDL 27 (L) 03/13/2020 1644   HDL 24 (L) 12/11/2012 0406   CHOLHDL 6.2 03/13/2020 1644   VLDL 38 03/13/2020 1644   VLDL 38 12/11/2012 0406   LDLCALC  102 (H) 03/13/2020 1644   LDLCALC 105 (H) 03/20/2019 1140   LDLCALC 82 12/11/2012 0406    Physical Exam:    VS:  There were no vitals taken for this visit.    Wt Readings from Last 3 Encounters:  03/14/20 157 lb 8 oz (71.4 kg)  05/01/19 156 lb (70.8 kg)  03/20/19 159 lb (72.1 kg)     GEN:  Well nourished, well developed in no acute distress HEENT: Normal NECK: No JVD; No carotid bruits LYMPHATICS: No lymphadenopathy CARDIAC: RRR, no murmurs, rubs, gallops RESPIRATORY:  Clear to auscultation without rales, wheezing or rhonchi  ABDOMEN: Soft, non-tender, non-distended MUSCULOSKELETAL:  No edema; No deformity  SKIN: Warm and dry NEUROLOGIC:  Alert and oriented x 3 PSYCHIATRIC:  Normal affect   ASSESSMENT:    1. Coronary artery disease involving native coronary artery of native heart without angina pectoris   2. Pure hypercholesterolemia    PLAN:    In order of problems listed above:  1.  ASCAD -cath 02/2020 showed nonobstrucitve CAD with 30% prox and mid RCA, 20% prox LCx and 20% mid LAD and normal LVEDP -he denies any anginal sx -continue ASA and statin  2.  HLD -LDL goal < 70 -continue atorvastatin 20mg  daily   Medication Adjustments/Labs and Tests Ordered: Current medicines are reviewed at length with the patient today.  Concerns regarding medicines are outlined above.  No orders of the defined types were placed in this encounter.  No orders of the defined types were placed in this encounter.   Signed, Fransico Him, MD  04/15/2020 7:26 AM    [place]

## 2020-04-25 ENCOUNTER — Other Ambulatory Visit: Payer: 59

## 2020-08-13 ENCOUNTER — Other Ambulatory Visit: Payer: Self-pay

## 2020-08-13 ENCOUNTER — Ambulatory Visit (INDEPENDENT_AMBULATORY_CARE_PROVIDER_SITE_OTHER): Payer: 59 | Admitting: Adult Health

## 2020-08-13 ENCOUNTER — Encounter: Payer: Self-pay | Admitting: Adult Health

## 2020-08-13 VITALS — BP 120/82 | HR 83 | Temp 97.5°F | Wt 160.0 lb

## 2020-08-13 DIAGNOSIS — M25512 Pain in left shoulder: Secondary | ICD-10-CM

## 2020-08-13 MED ORDER — TRAMADOL HCL 50 MG PO TABS: ORAL_TABLET | ORAL | 0 refills | Status: DC

## 2020-08-13 MED ORDER — PREDNISONE 20 MG PO TABS: ORAL_TABLET | ORAL | 0 refills | Status: DC

## 2020-08-13 NOTE — Progress Notes (Signed)
Assessment and Plan:  Victor Martin was seen today for shoulder pain.  Diagnoses and all orders for this visit:  Acute pain of left shoulder Exam limited by pain though suggestive of rotator cuff tendonitis, no weakness to suggest rupture. Due to severe limiting pain keeping him out of work will proceed with ortho referral,  Defer imaging to them Has tried OTC nsaid with minimal benefit; will give steroid, tramadol for severe breakthrough pain. Advised limit movement in shoulder. May try ice application.  -     Ambulatory referral to Orthopedics -     predniSONE (DELTASONE) 20 MG tablet; 2 tablets daily for 3 days, 1 tablet daily for 4 days. -     traMADol (ULTRAM) 50 MG tablet; Take 1 tab every 4 hours as needed for severe pain.  Further disposition pending results of labs. Discussed med's effects and SE's.   Over 30 minutes of exam, counseling, chart review, and critical decision making was performed.   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   He reports this began several months ago, ? Since March, no inciting event, no known injury. Started having pain with rolling over in bed on that side was painful, but would resolve in the morning while at work, but in the last 2 months seems worse, has had increasing episodes of severe pain with reaching motions, in the last month with constant pain. He reports since May has been unable to lift above shoulder height. Reports posterior shoulder pain, constant sharp pain, non radiating. Pain ranges 8/10  Has had sense of weakness with severe pain, denies neck  pain, numbness, tingling.   He reports has tried tylenol, advil (~6 tabs/day) without significant improvement. Didn't try heat or ice.   Pain was initially improved by putting his hand in his pocket, currently with hold shoulder to guard with some perceived improvement.   Worse with any quick movements, reaching forward or up, also rolling over on shoulder will wake him up.   Past Medical History:  Diagnosis Date  . Anxiety   . Anxiety state, unspecified 06/26/2014  . Arthritis   . GERD (gastroesophageal reflux disease)   . Hyperlipemia      Allergies  Allergen Reactions  . Nicotine Itching    Nicotine patch    Current Outpatient Medications on File Prior to Visit  Medication Sig  . aspirin EC 81 MG EC tablet Take 1 tablet (81 mg total) by mouth daily. (Patient not taking: Reported on 08/13/2020)  . atorvastatin (LIPITOR) 20 MG tablet Take 1 tablet (20 mg total) by mouth daily at 6 PM. (Patient not taking: Reported on 08/13/2020)  . Cholecalciferol (VITAMIN D3) 1.25 MG (50000 UT) CAPS Take 1 capsule 3 x week on Mon wed & Fri (Patient not taking: Reported on 03/13/2020)   No current facility-administered medications on file prior to visit.    ROS: all negative except above.   Physical Exam:  BP 120/82   Pulse 83   Temp (!) 97.5 F (36.4 C)   Wt 160 lb (72.6 kg)   SpO2 97%   BMI 22.96 kg/m   General Appearance:  Well nourished, appears in pain, no acute distress.  Eyes: conjunctiva no swelling or erythema ENT/Mouth: mask in place; Hearing normal.  Neck: Supple Respiratory: Respiratory effort normal, BS equal bilaterally without rales, rhonchi, wheezing or stridor.  Cardio: RRR with no MRGs. Brisk peripheral pulses without edema.  Lymphatics: Non tender without lymphadenopathy.  Musculoskeletal: normal gait. Left shoulder and clavible without palpable bony abnormality, no effusion in joint; he has significantly limited ROM due to pain, worse with abduction, flexion,  external rotation. Pain causes hesitancy but without sudden weakness.  Strength is normal and symmetric in arms. Skin: Warm, dry without rashes, lesions, ecchymosis.  Neuro: Normal muscle tone, Sensation intact.  Psych: Awake and oriented X 3, normal affect, Insight and Judgment appropriate.     [name], NP 11:01 AM [facility]

## 2020-08-13 NOTE — Patient Instructions (Signed)
Shoulder Pain Many things can cause shoulder pain, including:  An injury to the shoulder.  Overuse of the shoulder.  Arthritis. The source of the pain can be:  Inflammation.  An injury to the shoulder joint.  An injury to a tendon, ligament, or bone. Follow these instructions at home: Pay attention to changes in your symptoms. Let your health care provider know about them. Follow these instructions to relieve your pain. If you have a sling:  Wear the sling as told by your health care provider. Remove it only as told by your health care provider.  Loosen the sling if your fingers tingle, become numb, or turn cold and blue.  Keep the sling clean.  If the sling is not waterproof: ? Do not let it get wet. Remove it to shower or bathe.  Move your arm as little as possible, but keep your hand moving to prevent swelling. Managing pain, stiffness, and swelling   If directed, put ice on the painful area: ? Put ice in a plastic bag. ? Place a towel between your skin and the bag. ? Leave the ice on for 20 minutes, 2-3 times per day. Stop applying ice if it does not help with the pain.  Squeeze a soft ball or a foam pad as much as possible. This helps to keep the shoulder from swelling. It also helps to strengthen the arm. General instructions  Take over-the-counter and prescription medicines only as told by your health care provider.  Keep all follow-up visits as told by your health care provider. This is important. Contact a health care provider if:  Your pain gets worse.  Your pain is not relieved with medicines.  New pain develops in your arm, hand, or fingers. Get help right away if:  Your arm, hand, or fingers: ? Tingle. ? Become numb. ? Become swollen. ? Become painful. ? Turn white or blue. Summary  Shoulder pain can be caused by an injury, overuse, or arthritis.  Pay attention to changes in your symptoms. Let your health care provider know about  them.  This condition may be treated with a sling, ice, and pain medicines.  Contact your health care provider if the pain gets worse or new pain develops. Get help right away if your arm, hand, or fingers tingle or become numb, swollen, or painful.  Keep all follow-up visits as told by your health care provider. This is important. This information is not intended to replace advice given to you by your health care provider. Make sure you discuss any questions you have with your health care provider. Document Revised: 06/27/2018 Document Reviewed: 06/27/2018 Elsevier Patient Education  2020 Elsevier Inc.     Tramadol tablets What is this medicine? TRAMADOL (TRA ma dole) is a pain reliever. It is used to treat moderate to severe pain in adults. This medicine may be used for other purposes; ask your health care provider or pharmacist if you have questions. COMMON BRAND NAME(S): Ultram What should I tell my health care provider before I take this medicine? They need to know if you have any of these conditions:  brain tumor  depression  drug abuse or addiction  head injury  if you frequently drink alcohol containing drinks  kidney disease or trouble passing urine  liver disease  lung disease, asthma, or breathing problems  seizures or epilepsy  suicidal thoughts, plans, or attempt; a previous suicide attempt by you or a family member  an unusual or  allergic reaction to tramadol, codeine, other medicines, foods, dyes, or preservatives  pregnant or trying to get pregnant  breast-feeding How should I use this medicine? Take this medicine by mouth with a full glass of water. Follow the directions on the prescription label. You can take it with or without food. If it upsets your stomach, take it with food. Do not take your medicine more often than directed. A special MedGuide will be given to you by the pharmacist with each prescription and refill. Be sure to read this  information carefully each time. Talk to your pediatrician regarding the use of this medicine in children. Special care may be needed. Overdosage: If you think you have taken too much of this medicine contact a poison control center or emergency room at once. NOTE: This medicine is only for you. Do not share this medicine with others. What if I miss a dose? If you miss a dose, take it as soon as you can. If it is almost time for your next dose, take only that dose. Do not take double or extra doses. What may interact with this medicine? Do not take this medication with any of the following medicines:  MAOIs like Carbex, Eldepryl, Marplan, Nardil, and Parnate This medicine may also interact with the following medications:  alcohol  antihistamines for allergy, cough and cold  certain medicines for anxiety or sleep  certain medicines for depression like amitriptyline, fluoxetine, sertraline  certain medicines for migraine headache like almotriptan, eletriptan, frovatriptan, naratriptan, rizatriptan, sumatriptan, zolmitriptan  certain medicines for seizures like carbamazepine, oxcarbazepine, phenobarbital, primidone  certain medicines that treat or prevent blood clots like warfarin  digoxin  furazolidone  general anesthetics like halothane, isoflurane, methoxyflurane, propofol  linezolid  local anesthetics like lidocaine, pramoxine, tetracaine  medicines that relax muscles for surgery  other narcotic medicines for pain or cough  phenothiazines like chlorpromazine, mesoridazine, prochlorperazine, thioridazine  procarbazine This list may not describe all possible interactions. Give your health care provider a list of all the medicines, herbs, non-prescription drugs, or dietary supplements you use. Also tell them if you smoke, drink alcohol, or use illegal drugs. Some items may interact with your medicine. What should I watch for while using this medicine? Tell your health care  provider if your pain does not go away, if it gets worse, or if you have new or a different type of pain. You may develop tolerance to this drug. Tolerance means that you will need a higher dose of the drug for pain relief. Tolerance is normal and is expected if you take this drug for a long time. Do not suddenly stop taking your drug because you may develop a severe reaction. Your body becomes used to the drug. This does NOT mean you are addicted. Addiction is a behavior related to getting and using a drug for a nonmedical reason. If you have pain, you have a medical reason to take pain drug. Your health care provider will tell you how much drug to take. If your health care provider wants you to stop the drug, the dose will be slowly lowered over time to avoid any side effects. If you take other drugs that also cause drowsiness like other narcotic pain drugs, benzodiazepines, or other drugs for sleep, you may have more side effects. Give your health care provider a list of all drugs you use. He or she will tell you how much drug to take. Do not take more drug than directed. Get emergency help right away  if you have trouble breathing or are unusually tired or sleepy. Talk to your health care provider about naloxone and how to get it. Naloxone is an emergency drug used for an opioid overdose. An overdose can happen if you take too much opioid. It can also happen if an opioid is taken with some other drugs or substances, like alcohol. Know the symptoms of an overdose, like trouble breathing, unusually tired or sleepy, or not being able to respond or wake up. Make sure to tell caregivers and close contacts where it is stored. Make sure they know how to use it. After naloxone is given, you must get emergency help right away. Naloxone is a temporary treatment. Repeat doses may be needed. This drug may cause serious skin reactions. They can happen weeks to months after starting the drug. Contact your health care  provider right away if you notice fevers or flu-like symptoms with a rash. The rash may be red or purple and then turn into blisters or peeling of the skin. Or, you might notice a red rash with swelling of the face, lips, or lymph nodes in your neck or under your arms. You may get drowsy or dizzy. Do not drive, use machinery, or do anything that needs mental alertness until you know how this drug affects you. Do not stand up or sit up quickly, especially if you are an older patient. This reduces the risk of dizzy or fainting spells. Alcohol may interfere with the effect of this drug. Avoid alcoholic drinks. This drug will cause constipation. If you do not have a bowel movement for 3 days, call your health care provider. Your mouth may get dry. Chewing sugarless gum or sucking hard candy and drinking plenty of water may help. Contact your health care provider if the problem does not go away or is severe. What side effects may I notice from receiving this medicine? Side effects that you should report to your doctor or health care professional as soon as possible:  allergic reactions like skin rash, itching or hives, swelling of the face, lips, or tongue  breathing problems  confusion  redness, blistering, peeling or loosening of the skin, including inside the mouth  seizures  signs and symptoms of low blood pressure like dizziness; feeling faint or lightheaded, falls; unusually weak or tired  trouble passing urine or change in the amount of urine Side effects that usually do not require medical attention (report to your doctor or health care professional if they continue or are bothersome):  constipation  dry mouth  nausea, vomiting  tiredness This list may not describe all possible side effects. Call your doctor for medical advice about side effects. You may report side effects to FDA at 1-800-FDA-1088. Where should I keep my medicine? Keep out of the reach of children. This medicine  may cause accidental overdose and death if it taken by other adults, children, or pets. Mix any unused medicine with a substance like cat litter or coffee grounds. Then throw the medicine away in a sealed container like a sealed bag or a coffee can with a lid. Do not use the medicine after the expiration date. Store at room temperature between 15 and 30 degrees C (59 and 86 degrees F). NOTE: This sheet is a summary. It may not cover all possible information. If you have questions about this medicine, talk to your doctor, pharmacist, or health care provider.  2020 Elsevier/Gold Standard (2019-07-23 13:08:25)

## 2020-10-21 ENCOUNTER — Encounter: Payer: Self-pay | Admitting: Adult Health Nurse Practitioner

## 2020-10-21 ENCOUNTER — Other Ambulatory Visit: Payer: Self-pay

## 2020-10-21 ENCOUNTER — Ambulatory Visit (INDEPENDENT_AMBULATORY_CARE_PROVIDER_SITE_OTHER): Payer: 59 | Admitting: Adult Health Nurse Practitioner

## 2020-10-21 VITALS — BP 128/74 | HR 76 | Temp 97.6°F | Wt 161.0 lb

## 2020-10-21 DIAGNOSIS — M5441 Lumbago with sciatica, right side: Secondary | ICD-10-CM | POA: Diagnosis not present

## 2020-10-21 DIAGNOSIS — M5442 Lumbago with sciatica, left side: Secondary | ICD-10-CM

## 2020-10-21 DIAGNOSIS — G8929 Other chronic pain: Secondary | ICD-10-CM | POA: Diagnosis not present

## 2020-10-21 MED ORDER — METHOCARBAMOL 750 MG PO TABS: 750.0000 mg | ORAL_TABLET | Freq: Three times a day (TID) | ORAL | 1 refills | Status: DC | PRN

## 2020-10-21 MED ORDER — MELOXICAM 15 MG PO TABS: ORAL_TABLET | ORAL | 1 refills | Status: DC
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
## 2020-10-21 NOTE — Patient Instructions (Signed)
  Take Meloxicam 15mg  every morning.  Take this with food to avoid stomach upset.  Take for two weeks.  Then you can use as needed.  You can use Robaxin every 8 hours as needed to relax muscles.  Continue using ice  Please let know if you have any new or worsening symptoms.  Next step would be imaging and referral

## 2020-10-23 ENCOUNTER — Encounter: Payer: 59 | Admitting: Adult Health

## 2020-11-23 NOTE — Progress Notes (Signed)
COMPLETE PHYSICAL   Assessment and Plan:  Victor Martin was seen today for annual exam.  Diagnoses and all orders for this visit:  Encounter for routine adult health examination with abnormal findings Yearly -     CBC with Differential/Platelet -     COMPLETE METABOLIC PANEL WITH GFR  Abnormal glucose Discussed dietary and exercise modifications -     Hemoglobin A1c  Chronic bilateral low back pain with bilateral sciatica Follows with Dr Penni Bombard Receiving injections and PT, improving  Acute pain of left shoulder Improved Continue to monitor  Vitamin D deficiency No supplementation at this time -     VITAMIN D 25 Hydroxy (Vit-D Deficiency, Fractures)  Cigarette nicotine dependence with nicotine-induced disorder -     varenicline (CHANTIX STARTING MONTH PAK) 0.5 MG X 11 & 1 MG X 42 tablet; Take one 0.5 mg tablet by mouth once daily for 3 days, then increase to one 0.5 mg tablet twice daily for 4 days, then increase to one 1 mg tablet twice daily. Discussed taper stop of cigarettes.  Anxiety state Doing well at this time  Diverticulosis of sigmoid colon Diverticulitis Per CT 11/2012 -  Due for Colonoscopy, discussed with patient Agreeable to Cologuard.  COPD Per Xray 12/20 Discussed smoking cessation with patient No inhalers at this time  Screening for blood or protein in urine -     Urinalysis w microscopic + reflex cultur  Screening for thyroid disorder -     TSH  Screening for lipid disorders -     Lipid panel  Screening, ischemic heart disease -     EKG 12-Lead  Screening PSA (prostate specific antigen) -     PSA  Screening for malignant neoplasm of colon -     Cologuard  Encounter for vitamin deficiency screening -     Vitamin B12   Medication Management Continued     Further disposition pending results of labs. Discussed med's effects and SE's.   Over 40 minutes of face to face interview, exam, counseling, chart review, and critical decision  making was performed.   Future Appointments  Date Time Provider Department Center  11/24/2021 10:00 AM Elder Negus, NP [facility] None    ------------------------------------------------------------------------------------------------------------------   HPI 54 y.o.male presents for complete physical and follow up on abnormal glucose, weight, vitamin D deficiency, nicotine dependence and anxiety.    Patient was last seen in our office on 10/21/20 for low back pain, bilateral.  He was given meloxicam15mg  daily for two weeks and methocarbomal 750mg  PRN for muscle spasms.  Since then he reports the medication upset his stomach so as discussed he stopped taking.  He was seen by Dr on 8/18 and follow up 8/24 for first injection.  He has second injection scheduled for one day and then therapy to start next week.  He reports he has improved and happy with the progress.   He is a smoker and has been working on 9/24, currently smokes 1 pack a day. But has not been smoking the whole cigarette.  He has tried Chantix in the past but picked smoking up again when he started drinking ETOH.  Reports that he has not been drinking ETOH at all. Discussed this could be an ideal time to attempt again since this trigger is no longer a barrier for him.   Past Medical History:  Diagnosis Date   Anxiety    Anxiety state, unspecified 06/26/2014   Arthritis    GERD (gastroesophageal reflux disease)  Hyperlipemia      Allergies  Allergen Reactions   Nicotine Itching    Nicotine patch    Current Outpatient Medications on File Prior to Visit  Medication Sig   meloxicam (MOBIC) 15 MG tablet Take one daily with food for 2 weeks, can take with tylenol, can not take with aleve, iburpofen, then as needed daily for pain   methocarbamol (ROBAXIN-750) 750 MG tablet Take 1 tablet (750 mg total) by mouth every 8 (eight) hours as needed for muscle spasms.   No current facility-administered  medications on file prior to visit.    Names of Other Physician/Practitioners you currently use: 1. [facility] here for primary care 2. Eye Exam: Due 3. Dentist: Due Patient Care Team: Lucky Cowboy, MD as PCP - General (Internal Medicine) Quintella Reichert, MD as PCP - Cardiology (Cardiology)    Screening Tests: Immunization History  Administered Date(s) Administered   Influenza-Unspecified 10/09/2015   Tdap 10/28/2015    Preventative care: Last colonoscopy: DUE Cologuard ordered today    Vaccinations: TD or Tdap: 10/2015  Influenza: DUE, declined Pneumococcal:DUE, declined Shingrix: DUE, declined    ROS: Review of Systems  Constitutional: Negative for chills, diaphoresis, fever, malaise/fatigue and weight loss.  HENT: Negative for congestion, ear discharge, ear pain, hearing loss, nosebleeds, sinus pain, sore throat and tinnitus.   Eyes: Negative for blurred vision, double vision, photophobia, pain, discharge and redness.  Respiratory: Negative for cough, hemoptysis, sputum production, shortness of breath, wheezing and stridor.   Cardiovascular: Negative for chest pain, palpitations, orthopnea, claudication, leg swelling and PND.  Gastrointestinal: Negative for abdominal pain, blood in stool, constipation, diarrhea, heartburn, melena, nausea and vomiting.  Genitourinary: Negative for dysuria, flank pain, frequency, hematuria and urgency.  Musculoskeletal: Positive for back pain. Negative for falls, joint pain, myalgias and neck pain.  Skin: Negative for itching and rash.  Neurological: Negative for dizziness, tingling, tremors, sensory change, speech change, focal weakness, seizures, loss of consciousness, weakness and headaches.  Endo/Heme/Allergies: Negative for environmental allergies and polydipsia. Does not bruise/bleed easily.  Psychiatric/Behavioral: Negative for depression, hallucinations, memory loss, substance abuse and  suicidal ideas. The patient is not nervous/anxious and does not have insomnia.     Physical Exam:  BP 110/72    Pulse 100    Temp 98.4 F (36.9 C)    Ht 5\' 10"  (1.778 m)    Wt 166 lb (75.3 kg)    SpO2 97%    BMI 23.82 kg/m   General Appearance: Well nourished, in no apparent distress. Eyes: PERRLA, EOMs, conjunctiva no swelling or erythema Sinuses: No Frontal/maxillary tenderness ENT/Mouth: Ext aud canals clear, TMs without erythema, bulging. No erythema, swelling, or exudate on post pharynx.  Tonsils not swollen or erythematous. Hearing normal.  Neck: Supple, thyroid normal.  Respiratory: Respiratory effort normal, BS equal bilaterally without rales, rhonchi, wheezing or stridor.  Cardio: RRR with no MRGs. Brisk peripheral pulses without edema.  Abdomen: Soft, + BS.  Non tender, no guarding, rebound, hernias, masses. Lymphatics: Non tender without lymphadenopathy.  Musculoskeletal: Full ROM, 5/5 strength, Normal gait Skin: Warm, dry without rashes, lesions, ecchymosis.  Neuro: Cranial nerves intact. No cerebellar symptoms.  Psych: Awake and oriented X 3, normal affect, Insight and Judgment appropriate.    EKG: NSR  , DNP [facility] 11/24/2020  1:22 PM

## 2020-11-24 ENCOUNTER — Ambulatory Visit (INDEPENDENT_AMBULATORY_CARE_PROVIDER_SITE_OTHER): Payer: 59 | Admitting: Adult Health Nurse Practitioner

## 2020-11-24 ENCOUNTER — Other Ambulatory Visit: Payer: Self-pay

## 2020-11-24 ENCOUNTER — Encounter: Payer: Self-pay | Admitting: Adult Health Nurse Practitioner

## 2020-11-24 VITALS — BP 110/72 | HR 100 | Temp 98.4°F | Ht 70.0 in | Wt 166.0 lb

## 2020-11-24 DIAGNOSIS — Z136 Encounter for screening for cardiovascular disorders: Secondary | ICD-10-CM

## 2020-11-24 DIAGNOSIS — Z0000 Encounter for general adult medical examination without abnormal findings: Secondary | ICD-10-CM | POA: Diagnosis not present

## 2020-11-24 DIAGNOSIS — Z1389 Encounter for screening for other disorder: Secondary | ICD-10-CM

## 2020-11-24 DIAGNOSIS — Z13220 Encounter for screening for lipoid disorders: Secondary | ICD-10-CM

## 2020-11-24 DIAGNOSIS — M25512 Pain in left shoulder: Secondary | ICD-10-CM

## 2020-11-24 DIAGNOSIS — Z125 Encounter for screening for malignant neoplasm of prostate: Secondary | ICD-10-CM

## 2020-11-24 DIAGNOSIS — Z0001 Encounter for general adult medical examination with abnormal findings: Secondary | ICD-10-CM

## 2020-11-24 DIAGNOSIS — Z1211 Encounter for screening for malignant neoplasm of colon: Secondary | ICD-10-CM

## 2020-11-24 DIAGNOSIS — Z1329 Encounter for screening for other suspected endocrine disorder: Secondary | ICD-10-CM

## 2020-11-24 DIAGNOSIS — J449 Chronic obstructive pulmonary disease, unspecified: Secondary | ICD-10-CM

## 2020-11-24 DIAGNOSIS — F17219 Nicotine dependence, cigarettes, with unspecified nicotine-induced disorders: Secondary | ICD-10-CM

## 2020-11-24 DIAGNOSIS — F411 Generalized anxiety disorder: Secondary | ICD-10-CM

## 2020-11-24 DIAGNOSIS — K5730 Diverticulosis of large intestine without perforation or abscess without bleeding: Secondary | ICD-10-CM

## 2020-11-24 DIAGNOSIS — Z79899 Other long term (current) drug therapy: Secondary | ICD-10-CM

## 2020-11-24 DIAGNOSIS — G8929 Other chronic pain: Secondary | ICD-10-CM

## 2020-11-24 DIAGNOSIS — Z1321 Encounter for screening for nutritional disorder: Secondary | ICD-10-CM

## 2020-11-24 DIAGNOSIS — R7309 Other abnormal glucose: Secondary | ICD-10-CM

## 2020-11-24 DIAGNOSIS — E559 Vitamin D deficiency, unspecified: Secondary | ICD-10-CM

## 2020-11-24 MED ORDER — CHANTIX STARTING MONTH PAK 0.5 MG X 11 & 1 MG X 42 PO TABS: ORAL_TABLET | ORAL | 0 refills | Status: DC

## 2020-11-24 NOTE — Patient Instructions (Addendum)
   We sent in Chantix starter pack for you.  Make a plan to decrease number of cigarettes you smoke a day to help with your success.  After this we will proscribe the maintainace dose.  You will continue to take this for at least 3 months after you are no longer smoking.  You are due for colonoscopy.  Today we talked about Cologuard test.  If negative this is good for 3 years.  You will be contacted for this.    GENERAL HEALTH GOALS  Know what a healthy weight is for you (roughly BMI <25) and aim to maintain this  Aim for 7+ servings of fruits and vegetables daily  70-80+ fluid ounces of water or unsweet tea for healthy kidneys  Limit to max 1 drink of alcohol per day; avoid smoking/tobacco  Limit animal fats in diet for cholesterol and heart health - choose grass fed whenever available  Avoid highly processed foods, and foods high in saturated/trans fats  Aim for low stress - take time to unwind and care for your mental health  Aim for 150 min of moderate intensity exercise weekly for heart health, and weights twice weekly for bone health  Aim for 7-9 hours of sleep daily

## 2020-11-25 LAB — CBC WITH DIFFERENTIAL/PLATELET
Absolute Monocytes: 865 cells/uL (ref 200–950)
Basophils Absolute: 100 cells/uL (ref 0–200)
Basophils Relative: 1.1 %
Eosinophils Absolute: 719 cells/uL — ABNORMAL HIGH (ref 15–500)
Eosinophils Relative: 7.9 %
HCT: 46.5 % (ref 38.5–50.0)
Hemoglobin: 16.2 g/dL (ref 13.2–17.1)
Lymphs Abs: 2129 cells/uL (ref 850–3900)
MCH: 33.6 pg — ABNORMAL HIGH (ref 27.0–33.0)
MCHC: 34.8 g/dL (ref 32.0–36.0)
MCV: 96.5 fL (ref 80.0–100.0)
MPV: 11.1 fL (ref 7.5–12.5)
Monocytes Relative: 9.5 %
Neutro Abs: 5287 cells/uL (ref 1500–7800)
Neutrophils Relative %: 58.1 %
Platelets: 253 10*3/uL (ref 140–400)
RBC: 4.82 10*6/uL (ref 4.20–5.80)
RDW: 12.9 % (ref 11.0–15.0)
Total Lymphocyte: 23.4 %
WBC: 9.1 10*3/uL (ref 3.8–10.8)

## 2020-11-25 LAB — URINALYSIS W MICROSCOPIC + REFLEX CULTURE
Bacteria, UA: NONE SEEN /HPF
Bilirubin Urine: NEGATIVE
Hgb urine dipstick: NEGATIVE
Hyaline Cast: NONE SEEN /LPF
Ketones, ur: NEGATIVE
Leukocyte Esterase: NEGATIVE
Nitrites, Initial: NEGATIVE
Protein, ur: NEGATIVE
RBC / HPF: NONE SEEN /HPF (ref 0–2)
Specific Gravity, Urine: 1.027 (ref 1.001–1.03)
Squamous Epithelial / HPF: NONE SEEN /HPF (ref <=5)
WBC, UA: NONE SEEN /HPF (ref 0–5)
pH: <=5 (ref 5.0–8.0)

## 2020-11-25 LAB — COMPLETE METABOLIC PANEL WITH GFR
AG Ratio: 1.9 (calc) (ref 1.0–2.5)
ALT: 24 U/L (ref 9–46)
AST: 14 U/L (ref 10–35)
Albumin: 4.8 g/dL (ref 3.6–5.1)
Alkaline phosphatase (APISO): 80 U/L (ref 35–144)
BUN: 17 mg/dL (ref 7–25)
CO2: 29 mmol/L (ref 20–32)
Calcium: 10 mg/dL (ref 8.6–10.3)
Chloride: 105 mmol/L (ref 98–110)
Creat: 1.11 mg/dL (ref 0.70–1.33)
GFR, Est African American: 86 mL/min/{1.73_m2} (ref >=60)
GFR, Est Non African American: 74 mL/min/{1.73_m2} (ref >=60)
Globulin: 2.5 g/dL (calc) (ref 1.9–3.7)
Glucose, Bld: 76 mg/dL (ref 65–99)
Potassium: 4.7 mmol/L (ref 3.5–5.3)
Sodium: 141 mmol/L (ref 135–146)
Total Bilirubin: 0.5 mg/dL (ref 0.2–1.2)
Total Protein: 7.3 g/dL (ref 6.1–8.1)

## 2020-11-25 LAB — LIPID PANEL
Cholesterol: 163 mg/dL (ref <200)
HDL: 35 mg/dL — ABNORMAL LOW (ref >=40)
LDL Cholesterol (Calc): 97 mg/dL (calc)
Non-HDL Cholesterol (Calc): 128 mg/dL (calc) (ref <130)
Total CHOL/HDL Ratio: 4.7 (calc) (ref <5.0)
Triglycerides: 214 mg/dL — ABNORMAL HIGH (ref <150)

## 2020-11-25 LAB — TSH: TSH: 0.93 mIU/L (ref 0.40–4.50)

## 2020-11-25 LAB — VITAMIN D 25 HYDROXY (VIT D DEFICIENCY, FRACTURES): Vit D, 25-Hydroxy: 26 ng/mL — ABNORMAL LOW (ref 30–100)

## 2020-11-25 LAB — HEMOGLOBIN A1C
Hgb A1c MFr Bld: 5.3 % of total Hgb (ref <5.7)
Mean Plasma Glucose: 105 (calc)
eAG (mmol/L): 5.8 (calc)

## 2020-11-25 LAB — PSA: PSA: 0.52 ng/mL (ref <=4.0)

## 2020-11-25 LAB — VITAMIN B12: Vitamin B-12: 324 pg/mL (ref 200–1100)

## 2020-11-25 LAB — NO CULTURE INDICATED

## 2020-11-26 ENCOUNTER — Other Ambulatory Visit: Payer: Self-pay | Admitting: Adult Health Nurse Practitioner

## 2020-11-26 DIAGNOSIS — E559 Vitamin D deficiency, unspecified: Secondary | ICD-10-CM

## 2020-11-26 MED ORDER — CHOLECALCIFEROL 1.25 MG (50000 UT) PO CAPS: ORAL_CAPSULE | ORAL | 0 refills | Status: DC

## 2021-01-29 ENCOUNTER — Encounter: Payer: Self-pay | Admitting: Adult Health

## 2021-01-29 ENCOUNTER — Other Ambulatory Visit: Payer: Self-pay

## 2021-01-29 ENCOUNTER — Ambulatory Visit: Payer: 59 | Admitting: Adult Health

## 2021-01-29 VITALS — BP 128/70 | HR 94 | Temp 97.7°F | Wt 161.0 lb

## 2021-01-29 DIAGNOSIS — K2960 Other gastritis without bleeding: Secondary | ICD-10-CM | POA: Diagnosis not present

## 2021-01-29 DIAGNOSIS — T39395A Adverse effect of other nonsteroidal anti-inflammatory drugs [NSAID], initial encounter: Secondary | ICD-10-CM

## 2021-01-29 MED ORDER — OMEPRAZOLE 20 MG PO CPDR: DELAYED_RELEASE_CAPSULE | ORAL | Status: DC

## 2021-01-29 NOTE — Patient Instructions (Signed)
Do omeprazole twice daily before meals or bedtime for 2 weeks, then reduce to once daily for 1-2 weeks, then slowly taper off to famotidine (pepcid instead)      Food Choices for Gastroesophageal Reflux Disease, Adult When you have gastroesophageal reflux disease (GERD), the foods you eat and your eating habits are very important. Choosing the right foods can help ease your discomfort. Think about working with a food expert (dietitian) to help you make good choices. What are tips for following this plan? Reading food labels  Look for foods that are low in saturated fat. Foods that may help with your symptoms include: ? Foods that have less than 5% of daily value (DV) of fat. ? Foods that have 0 grams of trans fat. Cooking  Do not fry your food.  Cook your food by baking, steaming, grilling, or broiling. These are all methods that do not need a lot of fat for cooking.  To add flavor, try to use herbs that are low in spice and acidity. Meal planning  Choose healthy foods that are low in fat, such as: ? Fruits and vegetables. ? Whole grains. ? Low-fat dairy products. ? Lean meats, fish, and poultry.  Eat small meals often instead of eating 3 large meals each day. Eat your meals slowly in a place where you are relaxed. Avoid bending over or lying down until 2-3 hours after eating.  Limit high-fat foods such as fatty meats or fried foods.  Limit your intake of fatty foods, such as oils, butter, and shortening.  Avoid the following as told by your doctor: ? Foods that cause symptoms. These may be different for different people. Keep a food diary to keep track of foods that cause symptoms. ? Alcohol. ? Drinking a lot of liquid with meals. ? Eating meals during the 2-3 hours before bed.   Lifestyle  Stay at a healthy weight. Ask your doctor what weight is healthy for you. If you need to lose weight, work with your doctor to do so safely.  Exercise for at least 30 minutes on 5 or  more days each week, or as told by your doctor.  Wear loose-fitting clothes.  Do not smoke or use any products that contain nicotine or tobacco. If you need help quitting, ask your doctor.  Sleep with the head of your bed higher than your feet. Use a wedge under the mattress or blocks under the bed frame to raise the head of the bed.  Chew sugar-free gum after meals. What foods should eat? Eat a healthy, well-balanced diet of fruits, vegetables, whole grains, low-fat dairy products, lean meats, fish, and poultry. Each person is different. Foods that may cause symptoms in one person may not cause any symptoms in another person. Work with your doctor to find foods that are safe for you. The items listed above may not be a complete list of what you can eat and drink. Contact a food expert for more options.   What foods should I avoid? Limiting some of these foods may help in managing the symptoms of GERD. Everyone is different. Talk with a food expert or your doctor to help you find the exact foods to avoid, if any. Fruits Any fruits prepared with added fat. Any fruits that cause symptoms. For some people, this may include citrus fruits, such as oranges, grapefruit, pineapple, and lemons. Vegetables Deep-fried vegetables. [place] fries. Any vegetables prepared with added fat. Any vegetables that cause symptoms. For some people, this may  include tomatoes and tomato products, chili peppers, onions and garlic, and horseradish. Grains Pastries or quick breads with added fat. Meats and other proteins High-fat meats, such as fatty beef or pork, hot dogs, ribs, ham, sausage, salami, and bacon. Fried meat or protein, including fried fish and fried chicken. Nuts and nut butters, in large amounts. Dairy Whole milk and chocolate milk. Sour cream. Cream. Ice cream. Cream cheese. Milkshakes. Fats and oils Butter. Margarine. Shortening. Ghee. Beverages Coffee and tea, with or without caffeine. Carbonated  beverages. Sodas. Energy drinks. Fruit juice made with acidic fruits, such as orange or grapefruit. Tomato juice. Alcoholic drinks. Sweets and desserts Chocolate and cocoa. Donuts. Seasonings and condiments Pepper. Peppermint and spearmint. Added salt. Any condiments, herbs, or seasonings that cause symptoms. For some people, this may include curry, hot sauce, or vinegar-based salad dressings. The items listed above may not be a complete list of what you should not eat and drink. Contact a food expert for more options. Questions to ask your doctor Diet and lifestyle changes are often the first steps that are taken to manage symptoms of GERD. If diet and lifestyle changes do not help, talk with your doctor about taking medicines. Where to find more information  International Foundation for Gastrointestinal Disorders: aboutgerd.org Summary  When you have GERD, food and lifestyle choices are very important in easing your symptoms.  Eat small meals often instead of 3 large meals a day. Eat your meals slowly and in a place where you are relaxed.  Avoid bending over or lying down until 2-3 hours after eating.  Limit high-fat foods such as fatty meats or fried foods. This information is not intended to replace advice given to you by your health care provider. Make sure you discuss any questions you have with your health care provider. Document Revised: 06/23/2020 Document Reviewed: 06/23/2020 Elsevier Patient Education  2021 [employer].

## 2021-01-29 NOTE — Progress Notes (Signed)
Assessment and Plan:  Victor Martin was seen today for gi problem.  Diagnoses and all orders for this visit:  NSAID induced gastritis Suspect NSAID induced gastritis, GERD flare He is no longer taking NSAID, has initiated PPI with significant improvement in sx Denies sx suggestive of severe bleeding ulcer;  Benign exam; no labs or imaging indicated today Continue PPI per instructions, then transition to famotidine for maintenance if needed If recurrent sx off of PPI, return for h. Pylori testing (advised would need to be off PPI for 2 weeks)  If worsening/recurrent sx while still taking PPI, return for labs Avoid NSAIDs if possible in the future, or take with PPI; consider Cox2 agents -     omeprazole (PRILOSEC) 20 MG capsule; Take 1 cap BID prior to breafast and dinner for 2 weeks, then daily for 2 weeks.  Further disposition pending results of labs. Discussed med's effects and SE's.   Over 20 minutes of exam, counseling, chart review, and critical decision making was performed.   Future Appointments  Date Time Provider Department Center  11/24/2021 10:00 AM McClanahan, Bella Kennedy, NP [facility] None    ------------------------------------------------------------------------------------------------------------------   HPI BP 128/70   Pulse 94   Temp 97.7 F (36.5 C)   Wt 161 lb (73 kg)   SpO2 95%   BMI 23.10 kg/m   55 y.o.male smoker with remote hx of GERD presents for evaluation of abdominal pain. He reports hx of GERD 10+ years ago, had EGD, was advised "overproducing acid," reports was on zantac for 7-8 years then stopped, has done well managing with rare tums.   He reports 3-4 weeks ago starting having bloating, was watching diet and cutting back on caffeine; 4-5 days became much worse, severe constant burning/gnawing epigastric pain and had some emesis (yellow/bile), reports started taking omeprazole OTC daily since Monday and has noted significant improvement as of last night. Did  have some mild diarrhea in the last few days that is improving; he reports normal/light color, not greasy, no mucus, not black/tarry/coffee ground, blood in stool.  He does endorse some sense of chills this past week, improved today  Denies CP, dyspnea.   He denies alcohol intake. Did take course of meloxicam early December for back pain. Was taking daily with food as per instructions. Sx began within a few weeks.   Past Medical History:  Diagnosis Date  . Anxiety   . Anxiety state, unspecified 06/26/2014  . Arthritis   . GERD (gastroesophageal reflux disease)   . Hyperlipemia      Allergies  Allergen Reactions  . Nicotine Itching    Nicotine patch    Current Outpatient Medications on File Prior to Visit  Medication Sig  . Cholecalciferol 1.25 MG (50000 UT) capsule Take one tablet by mouth three days a week for twelve weeks.  . meloxicam (MOBIC) 15 MG tablet Take one daily with food for 2 weeks, can take with tylenol, can not take with aleve, iburpofen, then as needed daily for pain (Patient not taking: Reported on 01/29/2021)  . methocarbamol (ROBAXIN-750) 750 MG tablet Take 1 tablet (750 mg total) by mouth every 8 (eight) hours as needed for muscle spasms.  . varenicline (CHANTIX STARTING MONTH PAK) 0.5 MG X 11 & 1 MG X 42 tablet Take one 0.5 mg tablet by mouth once daily for 3 days, then increase to one 0.5 mg tablet twice daily for 4 days, then increase to one 1 mg tablet twice daily. (Patient not taking: Reported on 01/29/2021)  No current facility-administered medications on file prior to visit.   Allergies:  Allergies  Allergen Reactions  . Nicotine Itching    Nicotine patch   Surgical History:  He  has a past surgical history that includes Leg Surgery and LEFT HEART CATH AND CORONARY ANGIOGRAPHY (N/A, 03/14/2020). Family History:  Hisfamily history includes Colon polyps in an other family member; Diabetes in an other family member; Heart disease in an other family member;  Hyperlipidemia in an other family member; Hypertension in an other family member; Thyroid disease in an other family member. Social History:   reports that he has been smoking cigarettes. He has been smoking about 1.00 pack per day. He has never used smokeless tobacco. He reports current alcohol use. He reports that he does not use drugs.   ROS: all negative except above.   Physical Exam:  BP 128/70   Pulse 94   Temp 97.7 F (36.5 C)   Wt 161 lb (73 kg)   SpO2 95%   BMI 23.10 kg/m   General Appearance: Well nourished, in no apparent distress. Eyes: conjunctiva no swelling or erythema ENT/Mouth: No erythema, swelling, or exudate on post pharynx.  Tonsils not swollen or erythematous. Hearing normal.  Neck: Supple Respiratory: Respiratory effort normal, BS equal bilaterally without rales, rhonchi, wheezing or stridor.  Cardio: RRR with no MRGs. Brisk peripheral pulses without edema.  Abdomen: Soft, + BS.  Non tender, no guarding, rebound, hernias, masses. Lymphatics: Non tender without lymphadenopathy.  Musculoskeletal: no obvious deformity, symmetrical strength, normal gait.  Skin: Warm, dry without rashes, lesions, ecchymosis.  Neuro: Normal muscle tone Psych: Awake and oriented X 3, normal affect, Insight and Judgment appropriate.     [name], NP 8:58 AM [facility]

## 2021-04-29 IMAGING — DX DG CHEST 1V PORT
1 series · 1 of 1 positions shown · non-contrast
Comparison: 04/10/2012

CLINICAL DATA: Chest pain.

EXAM:
PORTABLE CHEST 1 VIEW

[chest]
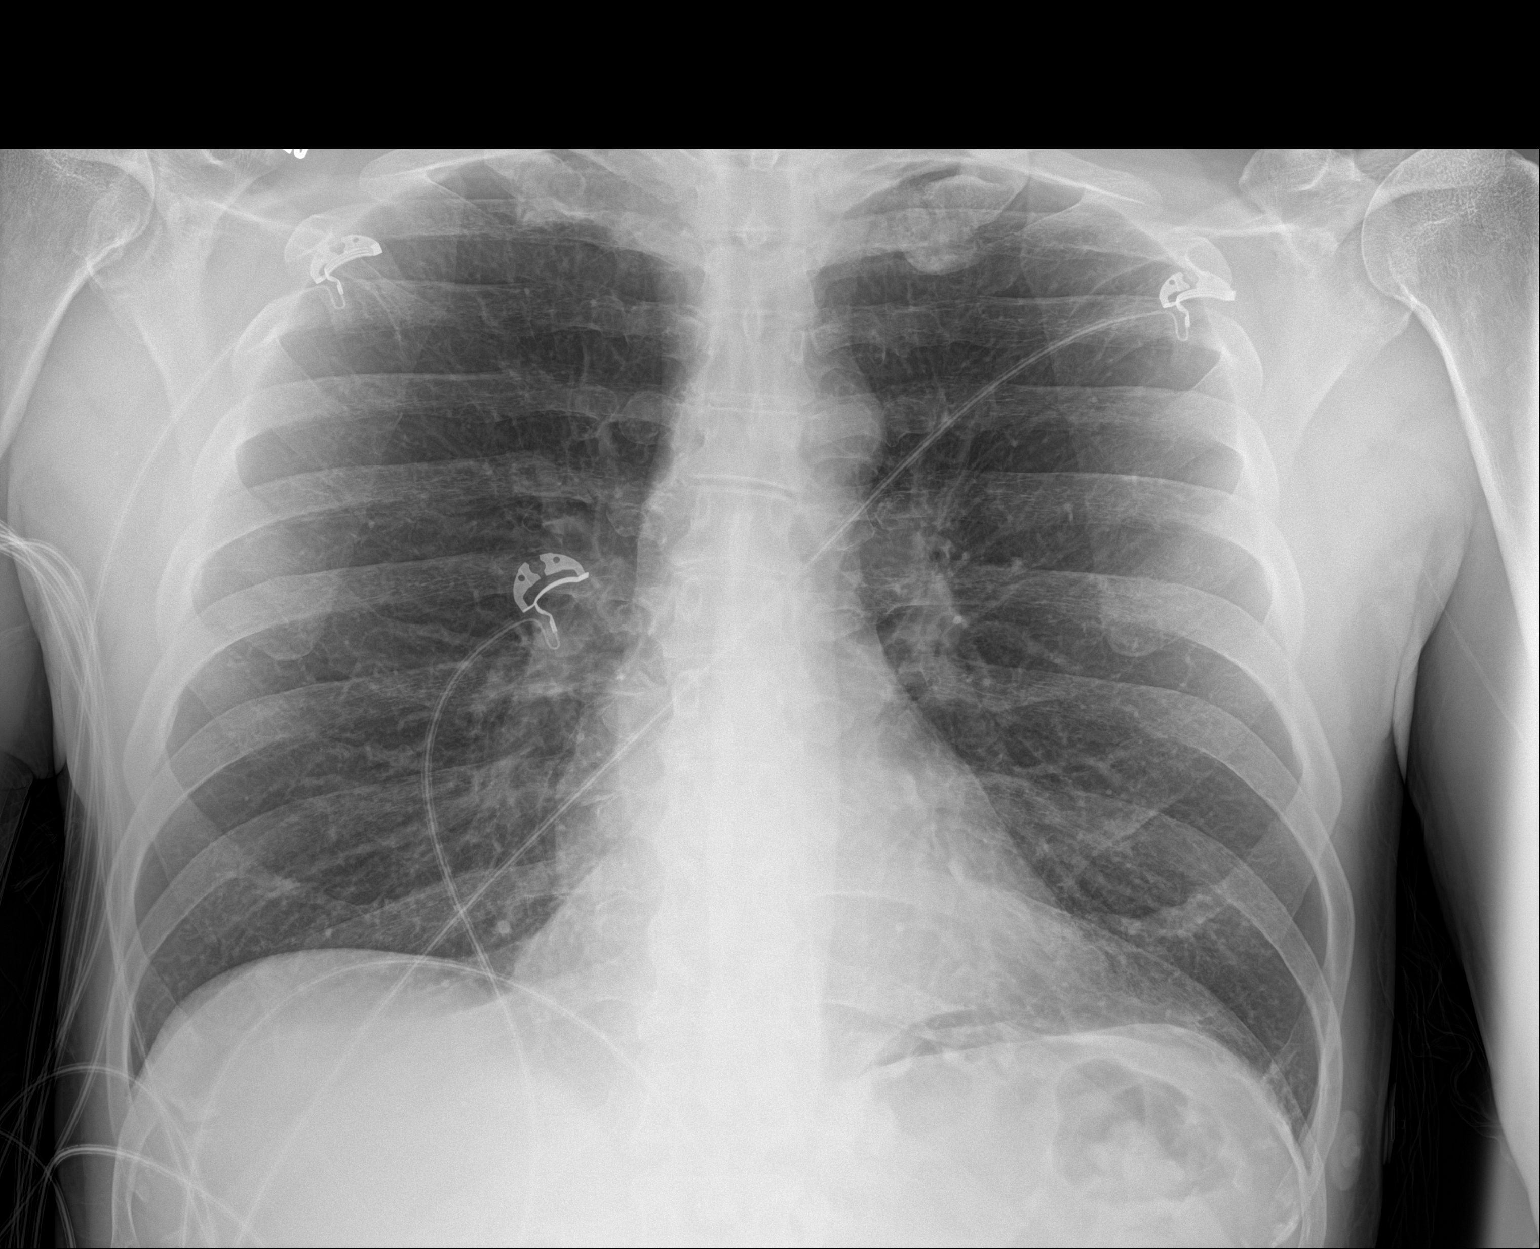

[1 of 1 positions shown; findings below may reference images not displayed]

FINDINGS: The heart size and mediastinal contours are within normal limits.
Both lungs are clear. The visualized skeletal structures are
unremarkable.
IMPRESSION: Normal exam.

## 2021-05-27 ENCOUNTER — Encounter: Payer: Self-pay | Admitting: Internal Medicine

## 2021-05-27 ENCOUNTER — Ambulatory Visit (INDEPENDENT_AMBULATORY_CARE_PROVIDER_SITE_OTHER): Payer: 59 | Admitting: Internal Medicine

## 2021-05-27 ENCOUNTER — Other Ambulatory Visit: Payer: Self-pay

## 2021-05-27 VITALS — BP 110/70 | HR 83 | Temp 97.7°F | Resp 17 | Ht 70.0 in | Wt 159.4 lb

## 2021-05-27 DIAGNOSIS — K4090 Unilateral inguinal hernia, without obstruction or gangrene, not specified as recurrent: Secondary | ICD-10-CM

## 2021-05-27 NOTE — Patient Instructions (Addendum)
Inguinal Hernia, Adult An inguinal hernia is when fat or your intestines push through a weak spot in a muscle where your leg meets your lower belly (groin). This causes a bulge. This kind of hernia could also be:  In your scrotum, if you are male.  In folds of skin around your vagina, if you are male. There are three types of inguinal hernias:  Hernias that can be pushed back into the belly (are reducible). This type rarely causes pain.  Hernias that cannot be pushed back into the belly (are incarcerated).  Hernias that cannot be pushed back into the belly and lose their blood supply (are strangulated). This type needs emergency surgery. What are the causes? This condition is caused by having a weak spot in the muscles or tissues in your groin. This develops over time. The hernia may poke through the weak spot when you strain your lower belly muscles all of a sudden, such as when you:  Lift a heavy object.  Strain to poop (have a bowel movement). Trouble pooping (constipation) can lead to straining.  Cough. What increases the risk? This condition is more likely to develop in:  Males.  Pregnant females.  People who: ? Are overweight. ? Work in jobs that require long periods of standing or heavy lifting. ? Have had an inguinal hernia before. ? Smoke or have lung disease. These factors can lead to long-term (chronic) coughing. What are the signs or symptoms? Symptoms may depend on the size of the hernia. Often, a small hernia has no symptoms. Symptoms of a larger hernia may include:  A bulge in the groin area. This is easier to see when standing. You might not be able to see it when you are lying down.  Pain or burning in the groin. This may get worse when you lift, strain, or cough.  A dull ache or a feeling of pressure in the groin.  An abnormal bulge in the scrotum, in males. Symptoms of a strangulated inguinal hernia may include:  A bulge in your groin that is very  painful and tender to the touch.  A bulge that turns red or purple.  Fever, feeling like you may vomit (nausea), and vomiting.  Not being able to poop or to pass gas. How is this treated? Treatment depends on the size of your hernia and whether you have symptoms. If you do not have symptoms, your doctor may have you watch your hernia carefully and have you come in for follow-up visits. If your hernia is large or if you have symptoms, you may need surgery to repair the hernia. Follow these instructions at home: Lifestyle  Avoid lifting heavy objects.  Avoid standing for long amounts of time.  Do not smoke or use any products that contain nicotine or tobacco. If you need help quitting, ask your doctor.  Stay at a healthy weight. Prevent trouble pooping You may need to take these actions to prevent or treat trouble pooping:  Drink enough fluid to keep your pee (urine) pale yellow.  Take over-the-counter or prescription medicines.  Eat foods that are high in fiber. These include beans, whole grains, and fresh fruits and vegetables.  Limit foods that are high in fat and sugar. These include fried or sweet foods. General instructions  You may try to push your hernia back in place by very gently pressing on it when you are lying down. Do not try to push the bulge back in if it will not go in   easily.  Watch your hernia for any changes in shape, size, or color. Tell your doctor if you see any changes.  Take over-the-counter and prescription medicines only as told by your doctor.  Keep all follow-up visits. Contact a doctor if:  You have a fever or chills.  You have new symptoms.  Your symptoms get worse. Get help right away if:  You have pain in your groin that gets worse all of a sudden.  You have a bulge in your groin that: ? Gets bigger all of a sudden, and it does not get smaller after that. ? Turns red or purple. ? Is painful when you touch it.  You are a male, and  you have: ? Sudden pain in your scrotum. ? A sudden change in the size of your scrotum.  You cannot push the hernia back in place by very gently pressing on it when you are lying down.  You feel like you may vomit, and that feeling does not go away.  You keep vomiting.  You have a fast heartbeat.  You cannot poop or pass gas. These symptoms may be an emergency. Get help right away. Call your local emergency services (911 in the [place]  Do not wait to see if the symptoms will go away.  Do not drive yourself to the hospital. Summary  An inguinal hernia is when fat or your intestines push through a weak spot in a muscle where your leg meets your lower belly (groin). This causes a bulge.  If you do not have symptoms, you may not need treatment. If you have symptoms or a large hernia, you may need surgery.  Avoid lifting heavy objects. Also, avoid standing for long amounts of time.  Do not try to push the bulge back in if it will not go in easily. This information is not intended to replace advice given to you by your health care provider. Make sure you discuss any questions you have with your health care provider. Document Revised: 08/12/2020 Document Reviewed: 08/12/2020 Elsevier Patient Education  2021 [employer]  

## 2021-05-27 NOTE — Progress Notes (Signed)
     Future Appointments  Date Time Provider Department Center  05/27/2021  2:00 PM Lucky Cowboy, MD [facility] None  11/24/2021 10:00 AM Elder Negus, NP [facility] None    History of Present Illness:     Patient is a very nice 55 yo DWM with  hx/o Chronic LBP , glucose intolerance, Vit D Deficiency who presents with c/o 4-5 day prodrome of gradually increasing pain in the left groin exacerbated with straining as lifting, pushing or pulling. Rates the pain at 2 up to 8 /10 . No other GI sx's.   Medications  - None   Problem list He has Anxiety state; Chest pain; and NSAID induced gastritis on their problem list.   Observations/Objective:  BP 110/70 (BP Location: Right Arm, Patient Position: Sitting, Cuff Size: Normal)   Pulse 83   Temp 97.7 F (36.5 C)   Resp 17   Ht 5\' 10"  (1.778 m)   Wt 159 lb 6.4 oz (72.3 kg)   SpO2 97%   BMI 22.87 kg/m   HEENT - WNL. Neck - supple.  Chest - Clear equal BS. Cor - Nl HS. RRR w/o sig MGR. PP 1(+). No edema. Abd -   Benign. Noted bulge above Lt Inguinal line.   GU   -   No RIH & Rt cord /testes - nl/neg.          -   Lt cord & testes Neg/Normal .          -   (+) tender Lt Inguinal Ring.  MS- FROM w/o deformities.  Gait Nl. Neuro -  Nl w/o focal abnormalities.   Assessment and Plan:   1. Left inguinal hernia  - Ambulatory referral to General Surgery   Follow Up Instructions:        I discussed the assessment and treatment plan with the patient. The patient was provided an opportunity to ask questions and all were answered. The patient agreed with the plan and demonstrated an understanding of the instructions.   , MD

## 2021-05-28 ENCOUNTER — Ambulatory Visit: Payer: 59 | Admitting: Internal Medicine

## 2021-10-26 ENCOUNTER — Encounter: Payer: 59 | Admitting: Adult Health

## 2021-11-23 NOTE — Progress Notes (Deleted)
COMPLETE PHYSICAL   Assessment and Plan:  Attila was seen today for annual exam.  Diagnoses and all orders for this visit:  Encounter for routine adult health examination with abnormal findings Yearly -     CBC with Differential/Platelet -     COMPLETE METABOLIC PANEL WITH GFR  Abnormal glucose Discussed dietary and exercise modifications -     Hemoglobin A1c  Chronic bilateral low back pain with bilateral sciatica Follows with Dr Penni Bombard Receiving injections and PT, improving  Acute pain of left shoulder Improved Continue to monitor  Vitamin D deficiency No supplementation at this time -     VITAMIN D 25 Hydroxy (Vit-D Deficiency, Fractures)  Cigarette nicotine dependence with nicotine-induced disorder -     varenicline (CHANTIX STARTING MONTH PAK) 0.5 MG X 11 & 1 MG X 42 tablet; Take one 0.5 mg tablet by mouth once daily for 3 days, then increase to one 0.5 mg tablet twice daily for 4 days, then increase to one 1 mg tablet twice daily. Discussed taper stop of cigarettes.  Anxiety state Doing well at this time  Diverticulosis of sigmoid colon Diverticulitis Per CT 11/2012 -  Due for Colonoscopy, discussed with patient Agreeable to Cologuard.  COPD Per Xray 12/20 Discussed smoking cessation with patient No inhalers at this time  Screening for blood or protein in urine -     Urinalysis w microscopic + reflex cultur  Screening for thyroid disorder -     TSH  Screening for lipid disorders -     Lipid panel  Screening, ischemic heart disease -     EKG 12-Lead  Screening PSA (prostate specific antigen) -     PSA  Screening for malignant neoplasm of colon -     Cologuard  Encounter for vitamin deficiency screening -     Vitamin B12   Medication Management Continued     Further disposition pending results of labs. Discussed med's effects and SE's.   Over 40 minutes of face to face interview, exam, counseling, chart review, and critical decision  making was performed.   Future Appointments  Date Time Provider Department Center  11/24/2021 10:00 AM Revonda Humphrey, NP [facility] None    ------------------------------------------------------------------------------------------------------------------   HPI 55 y.o.male presents for complete physical and follow up on abnormal glucose, weight, vitamin D deficiency, nicotine dependence and anxiety.    Patient was last seen in our office on 10/21/20 for low back pain, bilateral.  He was given meloxicam15mg  daily for two weeks and methocarbomal 750mg  PRN for muscle spasms.  Since then he reports the medication upset his stomach so as discussed he stopped taking.  He was seen by Dr on 8/18 and follow up 8/24 for first injection.  He has second injection scheduled for one day and then therapy to start next week.  He reports he has improved and happy with the progress.   He is a smoker and has been working on 9/24, currently smokes 1 pack a day. But has not been smoking the whole cigarette.  He has tried Chantix in the past but picked smoking up again when he started drinking ETOH.  Reports that he has not been drinking ETOH at all. Discussed this could be an ideal time to attempt again since this trigger is no longer a barrier for him.   Past Medical History:  Diagnosis Date   Anxiety    Anxiety state, unspecified 06/26/2014   Arthritis    GERD (gastroesophageal reflux disease)  Hyperlipemia      Allergies  Allergen Reactions   Nicotine Itching    Nicotine patch    No current outpatient medications on file prior to visit.   No current facility-administered medications on file prior to visit.    Names of Other Physician/Practitioners you currently use: 1. [facility] here for primary care 2. Eye Exam: Due 3. Dentist: Due Patient Care Team: Lucky Cowboy, MD as PCP - General (Internal Medicine) Quintella Reichert, MD as PCP -  Cardiology (Cardiology)    Screening Tests: Immunization History  Administered Date(s) Administered   Influenza-Unspecified 10/09/2015, 01/20/2021   PFIZER(Purple Top)SARS-COV-2 Vaccination 01/20/2021, 02/10/2021   Tdap 10/28/2015    Preventative care: Last colonoscopy: DUE Cologuard ordered today    Vaccinations: TD or Tdap: 10/2015  Influenza: DUE, declined Pneumococcal:DUE, declined Shingrix: DUE, declined    ROS: Review of Systems  Constitutional:  Negative for chills and fever.  HENT:  Negative for congestion, hearing loss, sinus pain, sore throat and tinnitus.   Eyes:  Negative for blurred vision and double vision.  Respiratory:  Negative for cough, hemoptysis, sputum production, shortness of breath and wheezing.   Cardiovascular:  Negative for chest pain, palpitations and leg swelling.  Gastrointestinal:  Negative for abdominal pain, constipation, diarrhea, heartburn, nausea and vomiting.  Genitourinary:  Negative for dysuria and urgency.  Musculoskeletal:  Negative for back pain, falls, joint pain, myalgias and neck pain.  Skin:  Negative for rash.  Neurological:  Negative for dizziness, tingling, tremors, weakness and headaches.  Endo/Heme/Allergies:  Does not bruise/bleed easily.  Psychiatric/Behavioral:  Negative for depression and suicidal ideas. The patient is not nervous/anxious and does not have insomnia.    Physical Exam:  There were no vitals taken for this visit.  General Appearance: Well nourished, in no apparent distress. Eyes: PERRLA, EOMs, conjunctiva no swelling or erythema Sinuses: No Frontal/maxillary tenderness ENT/Mouth: Ext aud canals clear, TMs without erythema, bulging. No erythema, swelling, or exudate on post pharynx.  Tonsils not swollen or erythematous. Hearing normal.  Neck: Supple, thyroid normal.  Respiratory: Respiratory effort normal, BS equal bilaterally without rales, rhonchi, wheezing or stridor.  Cardio: RRR with no MRGs.  Brisk peripheral pulses without edema.  Abdomen: Soft, + BS.  Non tender, no guarding, rebound, hernias, masses. Lymphatics: Non tender without lymphadenopathy.  Musculoskeletal: Full ROM, 5/5 strength, Normal gait Skin: Warm, dry without rashes, lesions, ecchymosis.  Neuro: Cranial nerves intact. No cerebellar symptoms.  Psych: Awake and oriented X 3, normal affect, Insight and Judgment appropriate.    EKG: NSR  Loni Dolly, DNP [facility] 11/23/2021  8:53 AM

## 2021-11-24 ENCOUNTER — Encounter: Payer: 59 | Admitting: Nurse Practitioner

## 2022-11-24 ENCOUNTER — Encounter: Payer: 59 | Admitting: Nurse Practitioner

## 2023-03-10 ENCOUNTER — Ambulatory Visit: Payer: 59 | Admitting: Internal Medicine

## 2023-03-10 ENCOUNTER — Encounter: Payer: Self-pay | Admitting: Internal Medicine

## 2023-03-10 VITALS — BP 128/70 | HR 60 | Temp 97.9°F | Resp 16 | Ht 70.0 in | Wt 157.4 lb

## 2023-03-10 DIAGNOSIS — S90424A Blister (nonthermal), right lesser toe(s), initial encounter: Secondary | ICD-10-CM

## 2023-03-10 NOTE — Progress Notes (Signed)
     No future appointments.  History of Present Illness:      Patient is a very nice 57 yo DWM with  hx/o Chronic LBP , glucose intolerance, Vit D Deficiency  who presents with a pressure type blister of his R medial 5th toe which is intermittently painful.      Allergies  Allergen Reactions   Nicotine Itching    Nicotine patch    Problem list He has Anxiety state; Chest pain; and NSAID induced gastritis on their problem list.   Observations/Objective:  BP 128/70   Pulse 60   Temp 97.9 F (36.6 C)   Resp 16   Ht 5\' 10"  (1.778 m)   Wt 157 lb 6.4 oz (71.4 kg)   SpO2 97%   BMI 22.58 kg/m   HEENT - WNL. Neck - supple.  Chest - Clear equal BS. Cor - Nl HS. RRR w/o sig MGR. PP 1(+). No edema. MS- FROM w/o deformities.  Gait Nl. Neuro -  Nl w/o focal abnormalities. Skin - focused on the RLE. PP are normal. There is a small 5 mm blister of the medial Rt 5th toe. No signs of cellulitis, erythema and capillary refill is normal to the toe. The superficial blister "cap" is sharply debrided and removed intact. No evidence of bleeding, cellulitis or infection (purulence). Pressure dressing is applied.   Assessment and Plan:   1. Blister of toe of right foot without infection, initial encounter   - Patient instructed in applying Dr Felicie Morn callus pads to the toe to prevent pressure ulceration.  Patient also recommended to purchase "wider" shoes   Follow Up Instructions:        I discussed the assessment and treatment plan with the patient. The patient was provided an opportunity to ask questions and all were answered. The patient agreed with the plan and demonstrated an understanding of the instructions.       The patient was advised to call back or seek an in-person evaluation if the symptoms worsen or if the condition fails to improve as anticipated.    Kirtland Bouchard, MD

## 2023-03-23 ENCOUNTER — Ambulatory Visit
Admission: EM | Admit: 2023-03-23 | Discharge: 2023-03-23 | Disposition: A | Discharge status: Home / Self Care | Payer: 59 | Attending: Emergency Medicine | Admitting: Emergency Medicine

## 2023-03-23 DIAGNOSIS — B349 Viral infection, unspecified: Secondary | ICD-10-CM | POA: Insufficient documentation

## 2023-03-23 DIAGNOSIS — U071 COVID-19: Secondary | ICD-10-CM | POA: Diagnosis not present

## 2023-03-23 LAB — SARS CORONAVIRUS 2 (TAT 6-24 HRS): SARS Coronavirus 2: POSITIVE — AB

## 2023-03-23 LAB — POCT INFLUENZA A/B
Influenza A, POC: NEGATIVE
Influenza B, POC: NEGATIVE

## 2023-03-23 NOTE — Discharge Instructions (Addendum)
Your flu test is negative.    Your COVID test is pending.    Take Tylenol as needed for fever or discomfort.  Rest and keep yourself hydrated.    Follow-up with your primary care provider if your symptoms are not improving.     

## 2023-03-23 NOTE — ED Provider Notes (Signed)
Victor Martin    CSN: [id number] Arrival date & time: 03/23/23  1056      History   Chief Complaint Chief Complaint  Patient presents with   Fever   Cough   Nasal Congestion    HPI Victor Martin is a 57 y.o. male.  Patient presents with intermittent subjective fever, fatigue, congestion, cough, headache, nausea, diarrhea x 5 days.  His symptoms improved and then returned yesterday.  His cough is nonproductive.  Treating with Tylenol and Mucinex.  He denies chest pain, shortness of breath, vomiting, or other symptoms.  His medical history includes GERD, arthritis, anxiety, hyperlipidemia. Current everyday smoker.    The history is provided by the patient and medical records.    Past Medical History:  Diagnosis Date   Anxiety    Anxiety state, unspecified 06/26/2014   Arthritis    GERD (gastroesophageal reflux disease)    Hyperlipemia     Patient Active Problem List   Diagnosis Date Noted   NSAID induced gastritis 01/29/2021   Chest pain 03/13/2020   Anxiety state 06/26/2014    Past Surgical History:  Procedure Laterality Date   LEFT HEART CATH AND CORONARY ANGIOGRAPHY N/A 03/14/2020   Procedure: LEFT HEART CATH AND CORONARY ANGIOGRAPHY;  Surgeon: Burnell Blanks, MD;  Location: [facility];  Service: Cardiovascular;  Laterality: N/A;   LEG SURGERY     steel rod in left leg       Home Medications    Prior to Admission medications   Not on File    Family History Family History  Problem Relation Age of Onset   Hypertension Other    Heart disease Other    Diabetes Other    Thyroid disease Other    Hyperlipidemia Other    Colon polyps Other     Social History Social History   Tobacco Use   Smoking status: Every Day    Packs/day: 1    Types: Cigarettes   Smokeless tobacco: Never  Substance Use Topics   Alcohol use: Yes    Comment: rarely   Drug use: No     Allergies   Nicotine   Review of Systems Review of  Systems  Constitutional:  Positive for chills, fatigue and fever.  HENT:  Positive for congestion, postnasal drip and sore throat. Negative for ear pain.   Respiratory:  Positive for cough. Negative for shortness of breath.   Cardiovascular:  Negative for chest pain and palpitations.  Gastrointestinal:  Positive for diarrhea and nausea. Negative for abdominal pain and vomiting.  Skin:  Negative for rash.  Neurological:  Positive for headaches.     Physical Exam Triage Vital Signs ED Triage Vitals  Enc Vitals Group     BP 03/23/23 1114 97/64     Pulse Rate 03/23/23 1111 99     Resp 03/23/23 1111 18     Temp 03/23/23 1111 98.2 F (36.8 C)     Temp src --      SpO2 03/23/23 1111 97 %     Weight 03/23/23 1113 165 lb (74.8 kg)     Height 03/23/23 1113 5\' 10"  (1.778 m)     Head Circumference --      Peak Flow --      Pain Score 03/23/23 1113 3     Pain Loc --      Pain Edu? --      Excl. in [place]? --    No data found.  Updated Vital Signs BP 97/64   Pulse 99   Temp 98.2 F (36.8 C)   Resp 18   Ht 5\' 10"  (1.778 m)   Wt 165 lb (74.8 kg)   SpO2 97%   BMI 23.68 kg/m   Visual Acuity Right Eye Distance:   Left Eye Distance:   Bilateral Distance:    Right Eye Near:   Left Eye Near:    Bilateral Near:     Physical Exam Vitals and nursing note reviewed.  Constitutional:      General: He is not in acute distress.    Appearance: He is well-developed. He is ill-appearing.  HENT:     Right Ear: Tympanic membrane normal.     Left Ear: Tympanic membrane normal.     Nose: Nose normal.     Mouth/Throat:     Mouth: Mucous membranes are moist.     Pharynx: Oropharynx is clear.  Cardiovascular:     Rate and Rhythm: Normal rate and regular rhythm.     Heart sounds: Normal heart sounds.  Pulmonary:     Effort: Pulmonary effort is normal. No respiratory distress.     Breath sounds: Normal breath sounds. No wheezing, rhonchi or rales.  Abdominal:     General: Bowel sounds are  normal.     Palpations: Abdomen is soft.     Tenderness: There is no abdominal tenderness. There is no guarding or rebound.  Musculoskeletal:     Cervical back: Neck supple.  Skin:    General: Skin is warm and dry.  Neurological:     Mental Status: He is alert.  Psychiatric:        Mood and Affect: Mood normal.        Behavior: Behavior normal.      UC Treatments / Results  Labs (all labs ordered are listed, but only abnormal results are displayed) Labs Reviewed  SARS CORONAVIRUS 2 (TAT 6-24 HRS)  POCT INFLUENZA A/B    EKG   Radiology No results found.  Procedures Procedures (including critical care time)  Medications Ordered in UC Medications - No data to display  Initial Impression / Assessment and Plan / UC Course  I have reviewed the triage vital signs and the nursing notes.  Pertinent labs & imaging results that were available during my care of the patient were reviewed by me and considered in my medical decision making (see chart for details).    Viral illness.  Rapid flu negative.  COVID pending.  Discussed symptomatic treatment including Tylenol, rest, hydration.  Instructed patient to follow up with PCP if symptoms are not improving.  He agrees to plan of care.   Final Clinical Impressions(s) / UC Diagnoses   Final diagnoses:  Viral illness     Discharge Instructions      Your flu test is negative.  Your COVID test is pending.    Take Tylenol as needed for fever or discomfort.  Rest and keep yourself hydrated.    Follow-up with your primary care provider if your symptoms are not improving.         ED Prescriptions   None    PDMP not reviewed this encounter.   Sharion Balloon, NP 03/23/23 1154

## 2023-03-23 NOTE — ED Triage Notes (Signed)
Patient to Urgent Care with complaints of cough, nasal congestion, fatigue and fevers. Headaches.   Symptoms started five days ago. Reports Monday he started feeling better. Symptoms then returned on Tuesday w/ fevers/ productive cough/ weakness. Reports feeling feverish and has been taking tylenol/ mucinex-DM.

## 2024-08-17 ENCOUNTER — Other Ambulatory Visit: Payer: Self-pay

## 2024-08-17 ENCOUNTER — Observation Stay
Admission: EM | Admit: 2024-08-17 | Discharge: 2024-08-17 | Disposition: A | Discharge status: Home / Self Care | Attending: Surgery | Admitting: Surgery

## 2024-08-17 ENCOUNTER — Observation Stay: Admitting: Certified Registered"

## 2024-08-17 ENCOUNTER — Encounter
Admission: EM | Disposition: A | Discharge status: Home / Self Care | Payer: Self-pay | Source: Home / Self Care | Attending: Emergency Medicine

## 2024-08-17 ENCOUNTER — Emergency Department

## 2024-08-17 DIAGNOSIS — E669 Obesity, unspecified: Secondary | ICD-10-CM | POA: Diagnosis not present

## 2024-08-17 DIAGNOSIS — Z6823 Body mass index (BMI) 23.0-23.9, adult: Secondary | ICD-10-CM | POA: Insufficient documentation

## 2024-08-17 DIAGNOSIS — K4130 Unilateral femoral hernia, with obstruction, without gangrene, not specified as recurrent: Secondary | ICD-10-CM | POA: Diagnosis not present

## 2024-08-17 DIAGNOSIS — K4030 Unilateral inguinal hernia, with obstruction, without gangrene, not specified as recurrent: Principal | ICD-10-CM | POA: Insufficient documentation

## 2024-08-17 DIAGNOSIS — F17210 Nicotine dependence, cigarettes, uncomplicated: Secondary | ICD-10-CM | POA: Diagnosis not present

## 2024-08-17 DIAGNOSIS — R1032 Left lower quadrant pain: Secondary | ICD-10-CM | POA: Diagnosis present

## 2024-08-17 HISTORY — PX: INGUINAL HERNIA REPAIR: SHX194

## 2024-08-17 LAB — CBC WITH DIFFERENTIAL/PLATELET
Abs Immature Granulocytes: 0.05 K/uL (ref 0.00–0.07)
Basophils Absolute: 0.1 K/uL (ref 0.0–0.1)
Basophils Relative: 1 %
Eosinophils Absolute: 0.2 K/uL (ref 0.0–0.5)
Eosinophils Relative: 2 %
HCT: 44.7 % (ref 39.0–52.0)
Hemoglobin: 15.1 g/dL (ref 13.0–17.0)
Immature Granulocytes: 0 %
Lymphocytes Relative: 11 %
Lymphs Abs: 1.3 K/uL (ref 0.7–4.0)
MCH: 32.9 pg (ref 26.0–34.0)
MCHC: 33.8 g/dL (ref 30.0–36.0)
MCV: 97.4 fL (ref 80.0–100.0)
Monocytes Absolute: 0.5 K/uL (ref 0.1–1.0)
Monocytes Relative: 4 %
Neutro Abs: 9.4 K/uL — ABNORMAL HIGH (ref 1.7–7.7)
Neutrophils Relative %: 82 %
Platelets: 230 K/uL (ref 150–400)
RBC: 4.59 MIL/uL (ref 4.22–5.81)
RDW: 13.4 % (ref 11.5–15.5)
WBC: 11.5 K/uL — ABNORMAL HIGH (ref 4.0–10.5)
nRBC: 0 % (ref 0.0–0.2)

## 2024-08-17 LAB — COMPREHENSIVE METABOLIC PANEL WITH GFR
ALT: 16 U/L (ref 0–44)
AST: 19 U/L (ref 15–41)
Albumin: 4.6 g/dL (ref 3.5–5.0)
Alkaline Phosphatase: 88 U/L (ref 38–126)
Anion gap: 8 (ref 5–15)
BUN: 16 mg/dL (ref 6–20)
CO2: 24 mmol/L (ref 22–32)
Calcium: 9.5 mg/dL (ref 8.9–10.3)
Chloride: 107 mmol/L (ref 98–111)
Creatinine, Ser: 1.14 mg/dL (ref 0.61–1.24)
GFR, Estimated: >60 mL/min (ref >60)
Glucose, Bld: 122 mg/dL — ABNORMAL HIGH (ref 70–99)
Potassium: 3.7 mmol/L (ref 3.5–5.1)
Sodium: 139 mmol/L (ref 135–145)
Total Bilirubin: 0.7 mg/dL (ref 0.0–1.2)
Total Protein: 7.5 g/dL (ref 6.5–8.1)

## 2024-08-17 SURGERY — REPAIR, HERNIA, INGUINAL, INCARCERATED
Anesthesia: General | Laterality: Left

## 2024-08-17 MED ORDER — ONDANSETRON HCL 4 MG/2ML IJ SOLN
INTRAMUSCULAR | Status: DC | PRN
  Administered 2024-08-17 (×2): 4 mg via INTRAVENOUS

## 2024-08-17 MED ORDER — FENTANYL CITRATE (PF) 100 MCG/2ML IJ SOLN
INTRAMUSCULAR | Status: DC | PRN
  Administered 2024-08-17: 50 ug via INTRAVENOUS

## 2024-08-17 MED ORDER — FENTANYL CITRATE (PF) 100 MCG/2ML IJ SOLN
INTRAMUSCULAR | Status: AC
  Filled 2024-08-17: qty 2

## 2024-08-17 MED ORDER — MIDAZOLAM HCL 2 MG/2ML IJ SOLN
INTRAMUSCULAR | Status: AC
  Filled 2024-08-17: qty 2

## 2024-08-17 MED ORDER — OXYCODONE HCL 5 MG/5ML PO SOLN: 5.0000 mg | Freq: Once | ORAL | Status: AC | PRN

## 2024-08-17 MED ORDER — DEXAMETHASONE SODIUM PHOSPHATE 10 MG/ML IJ SOLN
INTRAMUSCULAR | Status: AC
  Filled 2024-08-17: qty 1

## 2024-08-17 MED ORDER — HYDROMORPHONE HCL 1 MG/ML IJ SOLN: 0.5000 mg | INTRAMUSCULAR | Status: DC | PRN

## 2024-08-17 MED ORDER — HYDROCODONE-ACETAMINOPHEN 5-325 MG PO TABS: 1.0000 | ORAL_TABLET | ORAL | 0 refills | Status: DC | PRN

## 2024-08-17 MED ORDER — MIDAZOLAM HCL 2 MG/2ML IJ SOLN
INTRAMUSCULAR | Status: DC | PRN
  Administered 2024-08-17: 2 mg via INTRAVENOUS

## 2024-08-17 MED ORDER — PHENYLEPHRINE HCL-NACL 20-0.9 MG/250ML-% IV SOLN
INTRAVENOUS | Status: DC | PRN
  Administered 2024-08-17: 25 ug/min via INTRAVENOUS

## 2024-08-17 MED ORDER — BUPIVACAINE LIPOSOME 1.3 % IJ SUSP
INTRAMUSCULAR | Status: AC
  Filled 2024-08-17: qty 20

## 2024-08-17 MED ORDER — MORPHINE SULFATE (PF) 4 MG/ML IV SOLN
4.0000 mg | Freq: Once | INTRAVENOUS | Status: AC
  Administered 2024-08-17: 4 mg via INTRAVENOUS
  Filled 2024-08-17: qty 1

## 2024-08-17 MED ORDER — ACETAMINOPHEN 10 MG/ML IV SOLN
INTRAVENOUS | Status: DC | PRN
  Administered 2024-08-17: 1000 mg via INTRAVENOUS

## 2024-08-17 MED ORDER — OXYCODONE HCL 5 MG PO TABS
ORAL_TABLET | ORAL | Status: AC
  Filled 2024-08-17: qty 1

## 2024-08-17 MED ORDER — ACETAMINOPHEN 500 MG PO TABS: 1000.0000 mg | ORAL_TABLET | Freq: Four times a day (QID) | ORAL | Status: DC

## 2024-08-17 MED ORDER — ONDANSETRON HCL 4 MG/2ML IJ SOLN
INTRAMUSCULAR | Status: AC
  Filled 2024-08-17: qty 2

## 2024-08-17 MED ORDER — DEXAMETHASONE SODIUM PHOSPHATE 10 MG/ML IJ SOLN
INTRAMUSCULAR | Status: DC | PRN
  Administered 2024-08-17: 10 mg via INTRAVENOUS

## 2024-08-17 MED ORDER — BUPIVACAINE-EPINEPHRINE (PF) 0.25% -1:200000 IJ SOLN
INTRAMUSCULAR | Status: DC | PRN
  Administered 2024-08-17: 50 mL via INTRAMUSCULAR

## 2024-08-17 MED ORDER — PROPOFOL 10 MG/ML IV BOLUS
INTRAVENOUS | Status: DC | PRN
  Administered 2024-08-17: 200 mg via INTRAVENOUS

## 2024-08-17 MED ORDER — GLYCOPYRROLATE 0.2 MG/ML IJ SOLN
INTRAMUSCULAR | Status: DC | PRN
  Administered 2024-08-17: .2 mg via INTRAVENOUS

## 2024-08-17 MED ORDER — SUGAMMADEX SODIUM 200 MG/2ML IV SOLN
INTRAVENOUS | Status: DC | PRN
  Administered 2024-08-17: 300 mg via INTRAVENOUS

## 2024-08-17 MED ORDER — FENTANYL CITRATE (PF) 100 MCG/2ML IJ SOLN
25.0000 ug | INTRAMUSCULAR | Status: DC | PRN
  Administered 2024-08-17 (×2): 25 ug via INTRAVENOUS

## 2024-08-17 MED ORDER — ONDANSETRON 4 MG PO TBDP
4.0000 mg | ORAL_TABLET | Freq: Four times a day (QID) | ORAL | Status: DC | PRN
Start: 2024-08-17 — End: 2024-08-17

## 2024-08-17 MED ORDER — BUPIVACAINE-EPINEPHRINE (PF) 0.25% -1:200000 IJ SOLN
INTRAMUSCULAR | Status: AC
  Filled 2024-08-17: qty 30

## 2024-08-17 MED ORDER — ACETAMINOPHEN 10 MG/ML IV SOLN
INTRAVENOUS | Status: AC
  Filled 2024-08-17: qty 100

## 2024-08-17 MED ORDER — CEFAZOLIN SODIUM-DEXTROSE 2-4 GM/100ML-% IV SOLN
2.0000 g | INTRAVENOUS | Status: AC
  Administered 2024-08-17: 2 g via INTRAVENOUS

## 2024-08-17 MED ORDER — PHENYLEPHRINE 80 MCG/ML (10ML) SYRINGE FOR IV PUSH (FOR BLOOD PRESSURE SUPPORT)
PREFILLED_SYRINGE | INTRAVENOUS | Status: DC | PRN
  Administered 2024-08-17 (×2): 160 ug via INTRAVENOUS

## 2024-08-17 MED ORDER — ROCURONIUM BROMIDE 100 MG/10ML IV SOLN
INTRAVENOUS | Status: DC | PRN
  Administered 2024-08-17: 70 mg via INTRAVENOUS

## 2024-08-17 MED ORDER — ACETAMINOPHEN 10 MG/ML IV SOLN: 1000.0000 mg | Freq: Once | INTRAVENOUS | Status: DC | PRN

## 2024-08-17 MED ORDER — SEVOFLURANE IN SOLN
RESPIRATORY_TRACT | Status: AC
  Filled 2024-08-17: qty 250

## 2024-08-17 MED ORDER — OXYCODONE HCL 5 MG PO TABS: 5.0000 mg | ORAL_TABLET | ORAL | Status: DC | PRN

## 2024-08-17 MED ORDER — HYDROMORPHONE HCL 1 MG/ML IJ SOLN
1.0000 mg | Freq: Once | INTRAMUSCULAR | Status: AC
  Administered 2024-08-17: 1 mg via INTRAVENOUS
  Filled 2024-08-17: qty 1

## 2024-08-17 MED ORDER — ONDANSETRON HCL 4 MG/2ML IJ SOLN: 4.0000 mg | Freq: Four times a day (QID) | INTRAMUSCULAR | Status: DC | PRN

## 2024-08-17 MED ORDER — ONDANSETRON HCL 4 MG/2ML IJ SOLN
4.0000 mg | Freq: Once | INTRAMUSCULAR | Status: AC
  Administered 2024-08-17: 4 mg via INTRAVENOUS
  Filled 2024-08-17: qty 2

## 2024-08-17 MED ORDER — LIDOCAINE HCL (CARDIAC) PF 100 MG/5ML IV SOSY
PREFILLED_SYRINGE | INTRAVENOUS | Status: DC | PRN
  Administered 2024-08-17: 100 mg via INTRAVENOUS

## 2024-08-17 MED ORDER — OXYCODONE HCL 5 MG PO TABS
5.0000 mg | ORAL_TABLET | Freq: Once | ORAL | Status: AC | PRN
  Administered 2024-08-17: 5 mg via ORAL

## 2024-08-17 MED ORDER — CEFAZOLIN SODIUM-DEXTROSE 2-4 GM/100ML-% IV SOLN
INTRAVENOUS | Status: AC
  Filled 2024-08-17: qty 100

## 2024-08-17 MED ORDER — DROPERIDOL 2.5 MG/ML IJ SOLN: 0.6250 mg | Freq: Once | INTRAMUSCULAR | Status: DC | PRN

## 2024-08-17 MED ORDER — IOHEXOL 300 MG/ML  SOLN
100.0000 mL | Freq: Once | INTRAMUSCULAR | Status: AC | PRN
  Administered 2024-08-17: 100 mL via INTRAVENOUS

## 2024-08-17 MED ORDER — SODIUM CHLORIDE 0.9 % IV SOLN: INTRAVENOUS | Status: DC

## 2024-08-17 MED ORDER — DEXMEDETOMIDINE HCL IN NACL 80 MCG/20ML IV SOLN
INTRAVENOUS | Status: DC | PRN
  Administered 2024-08-17: 16 ug via INTRAVENOUS

## 2024-08-17 MED ORDER — SODIUM CHLORIDE 0.9 % IV BOLUS
1000.0000 mL | Freq: Once | INTRAVENOUS | Status: AC
  Administered 2024-08-17: 1000 mL via INTRAVENOUS

## 2024-08-17 MED ORDER — LACTATED RINGERS IV SOLN: INTRAVENOUS | Status: DC | PRN

## 2024-08-17 SURGICAL SUPPLY — 25 items
BLADE CLIPPER SURG (BLADE) IMPLANT
CHLORAPREP W/TINT 26 (MISCELLANEOUS) IMPLANT
DERMABOND ADVANCED .7 DNX12 (GAUZE/BANDAGES/DRESSINGS) ×1 IMPLANT
DRAIN PENROSE 0.625X18 (DRAIN) ×1 IMPLANT
DRAPE INCISE IOBAN 66X45 STRL (DRAPES) ×1 IMPLANT
DRAPE LAPAROTOMY 77X122 PED (DRAPES) ×1 IMPLANT
ELECTRODE REM PT RTRN 9FT ADLT (ELECTROSURGICAL) ×1 IMPLANT
GLOVE BIO SURGEON STRL SZ7 (GLOVE) ×1 IMPLANT
GOWN STRL REUS W/ TWL LRG LVL3 (GOWN DISPOSABLE) ×2 IMPLANT
MANIFOLD NEPTUNE II (INSTRUMENTS) ×1 IMPLANT
NDL HYPO 22X1.5 SAFETY MO (MISCELLANEOUS) ×1 IMPLANT
NEEDLE HYPO 22X1.5 SAFETY MO (MISCELLANEOUS) ×1 IMPLANT
NS IRRIG 1000ML POUR BTL (IV SOLUTION) ×1 IMPLANT
PACK BASIN MINOR ARMC (MISCELLANEOUS) ×1 IMPLANT
SPONGE KITTNER 5P (MISCELLANEOUS) ×1 IMPLANT
SPONGE T-LAP 18X18 ~~LOC~~+RFID (SPONGE) ×1 IMPLANT
SUT ETHIBOND NAB MO 7 #0 18IN (SUTURE) ×1 IMPLANT
SUT MNCRL AB 4-0 PS2 18 (SUTURE) ×1 IMPLANT
SUT SILK 2 0SH CR/8 30 (SUTURE) IMPLANT
SUT SILK 2-0 18XBRD TIE 12 (SUTURE) IMPLANT
SUT VIC AB 3-0 54X BRD REEL (SUTURE) ×1 IMPLANT
SUT VIC AB 3-0 SH 27X BRD (SUTURE) ×1 IMPLANT
SYR 20ML LL LF (SYRINGE) ×1 IMPLANT
TRAP FLUID SMOKE EVACUATOR (MISCELLANEOUS) ×1 IMPLANT
WATER STERILE IRR 500ML POUR (IV SOLUTION) ×1 IMPLANT

## 2024-08-17 NOTE — ED Notes (Signed)
 Pt to CT

## 2024-08-17 NOTE — H&P (Signed)
 [facility] SURGICAL HISTORY & PHYSICAL (cpt [phone])  HISTORY OF PRESENT ILLNESS (HPI):  58 y.o. male presented to [facility] ED today for LLQ abdominal pain. Patient reports he thought this was initially a kidney stone as the pain was similar and he has a history of such. He believes this started early this AM around 0500. No fever, chills, nausea, emesis, CP, SOB, urinary changes, nor stone changes. He reports a known history of left inguinal hernia which was diagnosed a few years ago but he has had no issues with this. He denied any trauma or injury prior to this. 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 He is not on any anticoagulation. He last ate at 10 PM. Work up in the ED revealed a mild leukocytosis to 11.5K but work up was otherwise reassuring. CT Abdomen/Pelvis revealed a left inguinal hernia with knuckle of small bowel and potentially developing obstruction.   General surgery is consulted by emergency medicine physician Dr Manus Rouse, MD for evaluation and management of incarcerated left inguinal hernia.   PAST MEDICAL HISTORY (PMH):  Past Medical History:  Diagnosis Date   Anxiety    Anxiety state, unspecified 06/26/2014   Arthritis    GERD (gastroesophageal reflux disease)    Hyperlipemia     Reviewed. Otherwise negative.   PAST SURGICAL HISTORY (PSH):  Past Surgical History:  Procedure Laterality Date   LEFT HEART CATH AND CORONARY ANGIOGRAPHY N/A 03/14/2020   Procedure: LEFT HEART CATH AND CORONARY ANGIOGRAPHY;  Surgeon: Verlin Lonni BIRCH, MD;  Location: [facility] INVASIVE CV LAB;  Service: Cardiovascular;  Laterality: N/A;   LEG SURGERY     steel rod in left leg    Reviewed. Otherwise negative.   MEDICATIONS:  Prior to Admission medications   Not on File     ALLERGIES:  Allergies  Allergen Reactions   Nicotine Itching    Nicotine patch     SOCIAL HISTORY:  Social History   Socioeconomic History   Marital status: Single    Spouse  name: Not on file   Number of children: Not on file   Years of education: Not on file   Highest education level: Not on file  Occupational History   Not on file  Tobacco Use   Smoking status: Every Day    Current packs/day: 1.00    Types: Cigarettes   Smokeless tobacco: Never  Substance and Sexual Activity   Alcohol use: Yes    Comment: rarely   Drug use: No   Sexual activity: Not on file  Other Topics Concern   Not on file  Social History Narrative   Not on file   Social Drivers of Health   Financial Resource Strain: Not on file  Food Insecurity: Not on file  Transportation Needs: Not on file  Physical Activity: Not on file  Stress: Not on file  Social Connections: Not on file  Intimate Partner Violence: Not on file     FAMILY HISTORY:  Family History  Problem Relation Age of Onset   Hypertension Other    Heart disease Other    Diabetes Other    Thyroid disease Other    Hyperlipidemia Other    Colon polyps Other     Otherwise negative.   REVIEW OF SYSTEMS:  Review of Systems  Constitutional:  Negative for chills and fever.  Respiratory:  Negative for cough and shortness of breath.   Cardiovascular:  Negative for chest pain and palpitations.  Gastrointestinal:  Positive  for abdominal pain. Negative for constipation, diarrhea, nausea and vomiting.  Genitourinary:  Negative for dysuria and urgency.  All other systems reviewed and are negative.   VITAL SIGNS:  Temp:  [98.1 F (36.7 C)] 98.1 F (36.7 C) (08/22 0837) Pulse Rate:  [74] 74 (08/22 0837) Resp:  [20] 20 (08/22 0837) BP: (121)/(45) 121/45 (08/22 0837) SpO2:  [100 %] 100 % (08/22 0837)             PHYSICAL EXAM:  Physical Exam Vitals and nursing note reviewed. Exam conducted with a chaperone present.  Constitutional:      General: He is not in acute distress.    Appearance: Normal appearance. He is normal weight. He is not ill-appearing.  HENT:     Head: Normocephalic and atraumatic.  Eyes:      General: No scleral icterus.    Conjunctiva/sclera: Conjunctivae normal.  Pulmonary:     Effort: Pulmonary effort is normal. No respiratory distress.  Abdominal:     General: There is no distension.     Palpations: Abdomen is soft.     Tenderness: There is abdominal tenderness in the left lower quadrant.     Hernia: A hernia is present. Hernia is present in the left inguinal area.     Comments: Left inguinal hernia with palpable small bowel, this is quite firm, he is very tender, no overlaying skin changes. Attempts at bedside reduction were unsuccessful and poorly tolerated.   Skin:    General: Skin is warm and dry.  Neurological:     General: No focal deficit present.     Mental Status: He is alert and oriented to person, place, and time.  Psychiatric:        Mood and Affect: Mood normal.        Behavior: Behavior normal.     INTAKE/OUTPUT:  This shift: No intake/output data recorded.  Last 2 shifts: @IOLAST2SHIFTS @  Labs:     Latest Ref Rng & Units 08/17/2024    9:34 AM 11/24/2020   11:05 AM 03/14/2020    3:37 AM  CBC  WBC 4.0 - 10.5 K/uL 11.5  9.1  9.2   Hemoglobin 13.0 - 17.0 g/dL 84.8  83.7  85.2   Hematocrit 39.0 - 52.0 % 44.7  46.5  43.1   Platelets 150 - 400 K/uL 230  253  228       Latest Ref Rng & Units 08/17/2024    9:34 AM 11/24/2020   11:05 AM 03/13/2020    4:52 PM  CMP  Glucose 70 - 99 mg/dL 877  76    BUN 6 - 20 mg/dL 16  17    Creatinine 9.38 - 1.24 mg/dL 8.85  8.88  9.14   Sodium 135 - 145 mmol/L 139  141    Potassium 3.5 - 5.1 mmol/L 3.7  4.7    Chloride 98 - 111 mmol/L 107  105    CO2 22 - 32 mmol/L 24  29    Calcium  8.9 - 10.3 mg/dL 9.5  89.9    Total Protein 6.5 - 8.1 g/dL 7.5  7.3    Total Bilirubin 0.0 - 1.2 mg/dL 0.7  0.5    Alkaline Phos 38 - 126 U/L 88     AST 15 - 41 U/L 19  14    ALT 0 - 44 U/L 16  24      Imaging studies:   CT Abdomen/Pelvis (08/17/2024) personally reviewed with left inguinal hernia containing knuckle  of small  bowel, some proximal small bowel dilation but mild, and radiologist report reviewed below:   IMPRESSION: 1. Left inguinal hernia is noted which contains a loop of small bowel, resulting in partial small bowel obstruction. 2. 12 mm exophytic hyperdense abnormality is seen posteriorly in upper pole of left kidney which is decreased in size compared to prior exam, but renal ultrasound is recommended for further evaluation. 3. Aortic atherosclerosis.   Assessment/Plan:  58 y.o. male with incarcerated left inguinal hernia.    - Will plan on admission to general surgery - Plan for left inguinal hernia repair with Dr Jordis pending OR/Anesthesia availability.  - All risks (bleeding, infection, need for bowel resection), benefits, and alternatives to above procedure(s) were discussed with the patient, all of his questions were answered to his expressed satisfaction, patient expresses he wishes to proceed, and informed consent was obtained.    - NPO for planned procedure - IV Abx (Ancef ) on call to OR for surgical prophylaxis   - Monitor abdominal examination - Pain control prn; antiemetics prn   - DVT prophylaxis; hold given need to proceed to OR  All of the above findings and recommendations were discussed with the patient, and all of his questions were answered to his expressed satisfaction.  -- Arthea Platt, PA-C [facility] 08/17/2024, 11:23 AM M-F: 7am - 4pm

## 2024-08-17 NOTE — ED Triage Notes (Signed)
 Pt to ED via POV for c/o left flank pain that began at 500. Pt states hx kidney stones.

## 2024-08-17 NOTE — Op Note (Addendum)
 Open Left femoral Hernia Repair       Pre-operative Diagnosis:  Left Inguinal Hernia incarcerated   Post-operative Diagnosis: Left incarcerated femoral hernia   Surgeon: Laneta Luna, MD FACS   Anesthesia: Gen. with endotracheal tube   Findings:  Femoral  hernia incarcerated with viable small bowel.      Procedure Details  The patient was seen again in the Holding Room. The benefits, complications, treatment options, and expected outcomes were discussed with the patient. The risks of bleeding, infection, recurrence of symptoms, failure to resolve symptoms, recurrence of hernia, ischemic orchitis, chronic pain syndrome or neuroma, were discussed again. The likelihood of improving the patient's symptoms with return to their baseline status is good.  The patient and/or family concurred with the proposed plan, giving informed consent.  The patient was taken to Operating Room, identified  and the procedure verified as  Inguinal Hernia Repair. Laterality confirmed.  A Time Out was held and the above information confirmed.   Prior to the induction of general anesthesia, antibiotic prophylaxis was administered. VTE prophylaxis was in place. General endotracheal anesthesia was then administered and tolerated well. After the induction, the abdomen was prepped with Chloraprep and draped in the sterile fashion. The patient was positioned in the supine position.    Left inguinal incision was created with a 15 blade knife and electrocautery was used to dissect through the subcutaneous tissue.  The external oblique was dissected and incised.    The ilioinguinal nerve was identified and preserved. Upon further inspection and dissection we were able to restore the anatomy, HThe hernia itself was not inguinal but rather femoral type. I was able to open the sac,  there was a loop of small bowel that was incarcerated but not perforated nor ischemic, only mild contused bowel was seen that looked very well after  reduction of the hernia. I was able to reduce the bowel and resected the sac. Given potential contamination on this pt w multiple risks factors and the femoral vessels being very close proximity to the defect I did not want to place a plug or any prosthetics. I was able to do a good tissue repair by approximating the conjoint tendon to the lacunar ligament to obliterate the hernia defect.  THe cord structures  were left intact as the defect did not involve the inguinal canal/.  I was able to close the external oblique fascia with 2-0 Vicryl in a running fashion.  Using Exparel  a field block was performed under direct visualization.  The subcutaneous tissue was approximated using 3-0 Vicryl 4-0 subcuticular Monocryl was used at all skin edges. Dermabond was placed.  Patient tolerated the procedure well. There were no complications. He was taken to the recovery room in stable condition.

## 2024-08-17 NOTE — Anesthesia Procedure Notes (Addendum)
 Procedure Name: Intubation Date/Time: 08/17/2024 4:42 AM  Performed by: Derick Jansky, RNPre-anesthesia Checklist: Patient identified, Emergency Drugs available, Suction available and Patient being monitored Patient Re-evaluated:Patient Re-evaluated prior to induction Oxygen Delivery Method: Circle system utilized Preoxygenation: Pre-oxygenation with 100% oxygen Induction Type: IV induction, Rapid sequence and Cricoid Pressure applied Laryngoscope Size: 4 and McGrath Grade View: Grade I Tube type: Oral Tube size: 7.0 mm Number of attempts: 1 Airway Equipment and Method: Stylet and Video-laryngoscopy Placement Confirmation: ETT inserted through vocal cords under direct vision, positive ETCO2 and breath sounds checked- equal and bilateral Secured at: 22 cm Tube secured with: Tape Dental Injury: Teeth and Oropharynx as per pre-operative assessment

## 2024-08-17 NOTE — ED Notes (Addendum)
 Pt adamantly refusing blood work in triage per flank pain protocol, stating it will slow everything down and asking if it is mandatory. Pt states if he has to wait out here he will just leave. Taken to room 8 by Olam, Charity fundraiser. Pt reports he just needs the CT scan and morphine 

## 2024-08-17 NOTE — Transfer of Care (Signed)
 Immediate Anesthesia Transfer of Care Note  Patient: Victor Martin  Procedure(s) Performed: REPAIR, HERNIA, INGUINAL, INCARCERATED (Left)  Patient Location: PACU  Anesthesia Type:General  Level of Consciousness: awake, drowsy, and patient cooperative  Airway & Oxygen Therapy: Patient Spontanous Breathing and Patient connected to face mask oxygen  Post-op Assessment: Report given to RN and Post -op Vital signs reviewed and stable  Post vital signs: Reviewed and stable  Last Vitals:  Vitals Value Taken Time  BP 92/44 08/17/24 13:45  Temp    Pulse 89 08/17/24 13:51  Resp 20 08/17/24 13:51  SpO2 93 % 08/17/24 13:51  Vitals shown include unfiled device data.  Last Pain:  Vitals:   08/17/24 1157  TempSrc: Temporal  PainSc: 2          Complications: No notable events documented.

## 2024-08-17 NOTE — Anesthesia Preprocedure Evaluation (Addendum)
 Anesthesia Evaluation  Patient identified by MRN, date of birth, ID band Patient awake    Reviewed: Allergy & Precautions, H&P , NPO status , Patient's Chart, lab work & pertinent test results  Airway Mallampati: II  TM Distance: >3 FB Neck ROM: full    Dental no notable dental hx.    Pulmonary Current Smoker and Patient abstained from smoking.   Pulmonary exam normal        Cardiovascular negative cardio ROS Normal cardiovascular exam     Neuro/Psych  PSYCHIATRIC DISORDERS Anxiety     negative neurological ROS     GI/Hepatic Neg liver ROS,GERD  ,,  Endo/Other  negative endocrine ROS    Renal/GU      Musculoskeletal   Abdominal  (+)  Abdomen: soft.   Peds  Hematology negative hematology ROS (+)   Anesthesia Other Findings IMPRESSION: 1. Left inguinal hernia is noted which contains a loop of small bowel, resulting in partial small bowel obstruction. 2. 12 mm exophytic hyperdense abnormality is seen posteriorly in upper pole of left kidney which is decreased in size compared to prior exam, but renal ultrasound is recommended for further evaluation. 3. Aortic atherosclerosis.     Past Medical History: No date: Anxiety 06/26/2014: Anxiety state, unspecified No date: Arthritis No date: GERD (gastroesophageal reflux disease) No date: Hyperlipemia  Past Surgical History: 03/14/2020: LEFT HEART CATH AND CORONARY ANGIOGRAPHY; N/A     Comment:  Procedure: LEFT HEART CATH AND CORONARY ANGIOGRAPHY;                Surgeon: Verlin Lonni BIRCH, MD;  Location: [facility]               INVASIVE CV LAB;  Service: Cardiovascular;  Laterality:               N/A; No date: LEG SURGERY     Comment:  steel rod in left leg  BMI    Body Mass Index: 23.68 kg/m      Reproductive/Obstetrics negative OB ROS                              Anesthesia Physical Anesthesia Plan  ASA: 2  Anesthesia Plan:  General ETT and General   Post-op Pain Management: Toradol IV (intra-op)* and Ofirmev  IV (intra-op)*   Induction: Intravenous  PONV Risk Score and Plan: 3 and Ondansetron , Dexamethasone  and Midazolam   Airway Management Planned: Oral ETT  Additional Equipment:   Intra-op Plan:   Post-operative Plan: Extubation in OR  Informed Consent: I have reviewed the patients History and Physical, chart, labs and discussed the procedure including the risks, benefits and alternatives for the proposed anesthesia with the patient or authorized representative who has indicated his/her understanding and acceptance.     Dental Advisory Given  Plan Discussed with: CRNA and Surgeon  Anesthesia Plan Comments:          Anesthesia Quick Evaluation

## 2024-08-17 NOTE — ED Notes (Signed)
 PA with OR at bedside and pt about to go to pre op.

## 2024-08-17 NOTE — Discharge Instructions (Addendum)
 Groin Hernia (Inguinal Hernia) in Adults: What to Know  A hernia happens when an organ or tissue in your body pushes out through a weak spot in the muscles of your belly. This makes a bulge. A groin hernia is also called an inguinal hernia. It's found in your groin, which is the area where your leg meets your lower belly. This kind of hernia could also be: In your scrotum, if you're male. In the folds of skin around your vagina, if you're male. You may be able to push the bulge back into your belly. If you can't push it in and blood flow is cut off to the hernia, you'll need surgery right away. What are the causes? A groin hernia may happen when you strain your belly muscles, such as when you: Lift a heavy object. Strain to poop. Cough. What increases the risk? You may be more likely to get a groin hernia if: You're male. You're 50 years or older. You're pregnant. You've had a groin hernia or belly surgery before. You smoke. You're overweight. You work at a job where you need to stand a lot or lift heavy things. What are the signs or symptoms? Symptoms may depend on how big the hernia is. If it's small, you may not have symptoms. If it's bigger, you may have: A bulge near your groin or genitals. Pain or burning in your groin. A dull ache or feeling of pressure in your groin. If blood flow is cut off to the tissues inside the hernia, you may also: Feel pain and tenderness when you touch the bulge. The skin over it may turn red or purple. Have a fever. Throw up or feel like you may throw up. Have trouble pooping or passing gas. How is this treated? Treatment depends on how big the hernia is and what symptoms you have. You may need: To be watched to see if the bulge grows bigger. Surgery. This may be done if the hernia is big or if you have symptoms. Follow these instructions at home: Lifestyle Ask if it's OK for you to lift. Try not to stand for long periods of time. Do not  smoke, vape, or use nicotine or tobacco. Stay at a healthy weight. Try not to do things that put pressure on your hernia. Preventing trouble pooping You may need to take these steps to help prevent or treat trouble pooping (constipation): Take medicines to help you poop. Eat foods high in fiber, like beans, whole grains, and fresh fruits and vegetables. Drink more fluids as told. General instructions Try to push the hernia back in place by very gently pressing on it while lying down. Do not try to force it back in if it won't push in easily. Watch your hernia for any changes in: Shape. Size. Color. Take your medicines only as told. Contact a doctor if: You have a fever. You have new symptoms. Your symptoms get worse. You can't poop or pass gas. Get help right away if: Your bulge: Starts to hurt a lot. Changes color. You have sudden pain in your scrotum, or your scrotum changes size. You can't gently push the hernia back in place. You feel like you may vomit, and that feeling does not go away. You keep throwing up or feeling like you need to throw up. These symptoms may be an emergency. Call 911 right away. Do not wait to see if the symptoms will go away. Do not drive yourself to the hospital. This information is  not intended to replace advice given to you by your health care provider. Make sure you discuss any questions you have with your health care provider. Document Revised: 08/11/2023 Document Reviewed: 08/11/2023 Elsevier Patient Education  2024 [employer].

## 2024-08-17 NOTE — ED Provider Notes (Signed)
 [facility] Provider Note    Event Date/Time   First MD Initiated Contact with Patient 08/17/24 [phone]     (approximate)   History   Chief Complaint: Flank Pain   HPI  Victor Martin is a 58 y.o. male with a history of kidney stones comes ED complaining of left lower quadrant abdominal pain which feels like a kidney stone.  Also reports urgency with difficulty urinating.  No fever or chills chest pain shortness of breath.  Notes that the pain started about 5:00 AM when the patient was at work, he went home but pain continued to intensify.  He does not do any heavy lifting or other strenuous activity.        Past Medical History:  Diagnosis Date   Anxiety    Anxiety state, unspecified 06/26/2014   Arthritis    GERD (gastroesophageal reflux disease)    Hyperlipemia       Past Surgical History:  Procedure Laterality Date   LEFT HEART CATH AND CORONARY ANGIOGRAPHY N/A 03/14/2020   Procedure: LEFT HEART CATH AND CORONARY ANGIOGRAPHY;  Surgeon: Verlin Lonni BIRCH, MD;  Location: [facility] INVASIVE CV LAB;  Service: Cardiovascular;  Laterality: N/A;   LEG SURGERY     steel rod in left leg    Physical Exam   Triage Vital Signs: ED Triage Vitals  Encounter Vitals Group     BP 08/17/24 0837 (!) 121/45     Girls Systolic BP Percentile --      Girls Diastolic BP Percentile --      Boys Systolic BP Percentile --      Boys Diastolic BP Percentile --      Pulse Rate 08/17/24 0837 74     Resp 08/17/24 0837 20     Temp 08/17/24 0837 98.1 F (36.7 C)     Temp Source 08/17/24 0837 Oral     SpO2 08/17/24 0837 100 %     Weight --      Height --      Head Circumference --      Peak Flow --      Pain Score 08/17/24 0838 10     Pain Loc --      Pain Education --      Exclude from Growth Chart --     Most recent vital signs: Vitals:   08/17/24 0837  BP: (!) 121/45  Pulse: 74  Resp: 20  Temp: 98.1 F (36.7 C)  SpO2: 100%    General: Awake, no  distress.  CV:  Good peripheral perfusion.  Regular rate rhythm Resp:  Normal effort.  Abd:  No distention.  Soft nontender.  There is an inguinal hernia which is firm, exquisitely tender to touch, not reducible, about 3 cm diameter. Other:  Withdrawal mucosa   ED Results / Procedures / Treatments   Labs (all labs ordered are listed, but only abnormal results are displayed) Labs Reviewed  COMPREHENSIVE METABOLIC PANEL WITH GFR - Abnormal; Notable for the following components:      Result Value   Glucose, Bld 122 (*)    All other components within normal limits  CBC WITH DIFFERENTIAL/PLATELET - Abnormal; Notable for the following components:   WBC 11.5 (*)    Neutro Abs 9.4 (*)    All other components within normal limits  URINALYSIS, W/ REFLEX TO CULTURE (INFECTION SUSPECTED)     EKG    RADIOLOGY CT abdomen pelvis interpreted by me, shows inguinal hernia.  Radiology report reviewed   PROCEDURES:  Procedures   MEDICATIONS ORDERED IN ED: Medications  HYDROmorphone  (DILAUDID ) injection 1 mg (has no administration in time range)  ondansetron  (ZOFRAN ) injection 4 mg (4 mg Intravenous Given 08/17/24 0957)  morphine  (PF) 4 MG/ML injection 4 mg (4 mg Intravenous Given 08/17/24 1000)  sodium chloride  0.9 % bolus 1,000 mL (1,000 mLs Intravenous New Bag/Given 08/17/24 1005)  iohexol  (OMNIPAQUE ) 300 MG/ML solution 100 mL (100 mLs Intravenous Contrast Given 08/17/24 1039)     IMPRESSION / MDM / ASSESSMENT AND PLAN / ED COURSE  I reviewed the triage vital signs and the nursing notes.  DDx: Incarcerated inguinal hernia, ureterolithiasis, cystitis, diverticulitis, abdominal abscess  Patient's presentation is most consistent with acute presentation with potential threat to life or bodily function.  Presents with left lower quadrant abdominal pain, clinically appears to be due to an incarcerated inguinal hernia.  Patient reports history of kidney stones as well.  Will obtain CT to  further evaluate, check labs, morphine  Zofran .   ----------------------------------------- 11:22 AM on 08/17/2024 ----------------------------------------- CT confirms inguinal hernia containing loop of bowel with developing small bowel obstruction.  After IV morphine , I again attempted to reduce the hernia at bedside, unsuccessful and patient unable to tolerate further manipulation.  N.p.o. since 10 PM last night.  Not taking any medications except for Tylenol  occasionally.  No anticoagulation/antiplatelet use.  Case discussed with surgery who will evaluate.      FINAL CLINICAL IMPRESSION(S) / ED DIAGNOSES   Final diagnoses:  Incarcerated inguinal hernia     Rx / DC Orders   ED Discharge Orders     None        Note:  This document was prepared using Dragon voice recognition software and may include unintentional dictation errors.   Viviann Pastor, MD 08/17/24 [phone]

## 2024-08-18 NOTE — Discharge Summary (Signed)
  Patient ID: SWAN ZAYED MRN: [id number] DOB/AGE: June 28, 1966 58 y.o.  Admit date: 08/17/2024 Discharge date: 08/17/24   Discharge Diagnoses:  Principal Problem:   Incarcerated left inguinal hernia Active Problems:   Incarcerated femoral hernia   Procedures:Open femoral Hernia repair   Hospital Course:  This is a 58 year old male with acute onset of left inguinal pain workup revealed evidence of incarcerated inguinal hernia with he was emergently taken to the operating room for left groin exploration.  He was found to have an incarcerated left femoral hernia that was repaired using tissue technique. HE did very well and was kept in observation in PACU, he wanted to go home and  At The time of discharge the patient was ambulating,  pain was controlled.  Hir vital signs were stable and she was afebrile.   physical exam at discharge showed a pt  in no acute distress.  Awake and alert.  Abdomen: Soft incisions healing well without infection or peritonitis.  Extremities well-perfused and no edema.  Condition of the patient the time of discharge was stable   Consults:   Disposition: Discharge disposition: 01-Home or Self Care       Discharge Instructions     Call MD for:  difficulty breathing, headache or visual disturbances   Complete by: As directed    Call MD for:  extreme fatigue   Complete by: As directed    Call MD for:  hives   Complete by: As directed    Call MD for:  persistant dizziness or light-headedness   Complete by: As directed    Call MD for:  persistant nausea and vomiting   Complete by: As directed    Call MD for:  redness, tenderness, or signs of infection (pain, swelling, redness, odor or green/yellow discharge around incision site)   Complete by: As directed    Call MD for:  severe uncontrolled pain   Complete by: As directed    Call MD for:  temperature >100.4   Complete by: As directed    Diet - low sodium heart healthy   Complete by: As  directed    Discharge instructions   Complete by: As directed    Shower 48 hrs   Increase activity slowly   Complete by: As directed    Lifting restrictions   Complete by: As directed    20 lbs x 6 wks      Allergies as of 08/17/2024       Reactions   Nicotine Itching   Nicotine patch        Medication List     TAKE these medications    HYDROcodone -acetaminophen  5-325 MG tablet Commonly known as: NORCO/VICODIN Take 1-2 tablets by mouth every 4 (four) hours as needed for moderate pain (pain score 4-6).        Follow-up Information     Granger, [place], MD Follow up on 09/03/2024.   Specialty: General Surgery Why: Please call Monday morning to schedule a follow up appointment Contact information: [address] [place] [address] [phone]                  Laneta Luna, MD FACS

## 2024-08-20 ENCOUNTER — Encounter: Payer: Self-pay | Admitting: Surgery

## 2024-08-21 NOTE — Anesthesia Postprocedure Evaluation (Signed)
 Anesthesia Post Note  Patient: Victor Martin  Procedure(s) Performed: REPAIR, HERNIA, INGUINAL, INCARCERATED (Left)  Patient location during evaluation: PACU Anesthesia Type: General Level of consciousness: awake and alert Pain management: pain level controlled Vital Signs Assessment: post-procedure vital signs reviewed and stable Respiratory status: spontaneous breathing, nonlabored ventilation and respiratory function stable Cardiovascular status: blood pressure returned to baseline and stable Postop Assessment: no apparent nausea or vomiting Anesthetic complications: no   No notable events documented.   Last Vitals:  Vitals:   08/17/24 1446 08/17/24 1626  BP: 104/74 (!) 96/53  Pulse: 94 65  Resp: 16 16  Temp:    SpO2: 100% 100%    Last Pain:  Vitals:   08/17/24 1446  TempSrc:   PainSc: 3                  Camellia Merilee Louder

## 2024-09-03 ENCOUNTER — Ambulatory Visit (INDEPENDENT_AMBULATORY_CARE_PROVIDER_SITE_OTHER): Admitting: Surgery

## 2024-09-03 ENCOUNTER — Encounter: Payer: Self-pay | Admitting: Surgery

## 2024-09-03 VITALS — BP 118/70 | HR 88 | Temp 98.7°F | Ht 70.0 in | Wt 145.6 lb

## 2024-09-03 DIAGNOSIS — Z09 Encounter for follow-up examination after completed treatment for conditions other than malignant neoplasm: Secondary | ICD-10-CM

## 2024-09-03 DIAGNOSIS — Z1211 Encounter for screening for malignant neoplasm of colon: Secondary | ICD-10-CM

## 2024-09-03 DIAGNOSIS — K4130 Unilateral femoral hernia, with obstruction, without gangrene, not specified as recurrent: Secondary | ICD-10-CM

## 2024-09-03 NOTE — Patient Instructions (Signed)

## 2024-09-03 NOTE — Progress Notes (Signed)
 Outpatient Surgical Follow Up  09/03/2024  Victor Martin is an 58 y.o. male.   Chief Complaint  Patient presents with   Routine Post Op    Inguinal hernia repair 08/17/24    HPI: s/p Left femoral hernia repair, doing well, some pains, no fevers, tolerating po. She does report 20 lbs weight loss for the last few months.  Past Medical History:  Diagnosis Date   Anxiety    Anxiety state, unspecified 06/26/2014   Arthritis    GERD (gastroesophageal reflux disease)    Hyperlipemia     Past Surgical History:  Procedure Laterality Date   INGUINAL HERNIA REPAIR Left 08/17/2024   Procedure: REPAIR, HERNIA, INGUINAL, INCARCERATED;  Surgeon: Jordis Laneta FALCON, MD;  Location: [facility] ORS;  Service: General;  Laterality: Left;   LEFT HEART CATH AND CORONARY ANGIOGRAPHY N/A 03/14/2020   Procedure: LEFT HEART CATH AND CORONARY ANGIOGRAPHY;  Surgeon: Verlin Lonni BIRCH, MD;  Location: [facility];  Service: Cardiovascular;  Laterality: N/A;   LEG SURGERY     steel rod in left leg    Family History  Problem Relation Age of Onset   Hypertension Other    Heart disease Other    Diabetes Other    Thyroid disease Other    Hyperlipidemia Other    Colon polyps Other     Social History:  reports that he has been smoking cigarettes. He has never used smokeless tobacco. He reports current alcohol use. He reports that he does not use drugs.  Allergies:  Allergies  Allergen Reactions   Nicotine Itching    Nicotine patch    Medications reviewed.    ROS Full ROS performed and is otherwise negative other than what is stated in HPI   BP 118/70   Pulse 88   Temp 98.7 F (37.1 C) (Oral)   Ht 5' 10 (1.778 m)   Wt 145 lb 9.6 oz (66 kg)   SpO2 95%   BMI 20.89 kg/m   Physical Exam   NAd alert Abd: soft, nt, incision healing well, no infection or recurrence   Assessment/Plan: Doing well after femoral hernia repair Weight loss, will need colonoscopy RTW 3 weeks for now  limited weight restrictions  Laneta Jordis, MD [facility] General Surgeon

## 2024-09-05 ENCOUNTER — Telehealth: Payer: Self-pay

## 2024-09-05 NOTE — Telephone Encounter (Signed)
 Left message on voicemail  Pt to be scheduled on 11/26 with Dr Marinda

## 2024-09-17 ENCOUNTER — Encounter: Payer: Self-pay | Admitting: Surgery

## 2024-09-21 NOTE — Telephone Encounter (Signed)
 Left message on voicemail for pt to return my call to schedule colonoscopy

## 2024-09-28 ENCOUNTER — Telehealth: Payer: Self-pay | Admitting: *Deleted

## 2024-09-28 NOTE — Telephone Encounter (Signed)
 Left patient a message letting him know that his paperwork is up front ready to be picked up

## 2024-10-03 NOTE — Telephone Encounter (Signed)
 Left message on voicemail.
